# Patient Record
Sex: Male | Born: 1985 | Race: White | Hispanic: No | Marital: Single | Smoking: Current some day smoker
Health system: Southern US, Community
[De-identification: ages and names within clinical notes are randomized; demographics above are authoritative.]

## PROBLEM LIST (undated history)

## (undated) DIAGNOSIS — J45909 Unspecified asthma, uncomplicated: Secondary | ICD-10-CM

## (undated) DIAGNOSIS — E119 Type 2 diabetes mellitus without complications: Secondary | ICD-10-CM

## (undated) DIAGNOSIS — L409 Psoriasis, unspecified: Secondary | ICD-10-CM

## (undated) HISTORY — DX: Unspecified asthma, uncomplicated: J45.909

## (undated) HISTORY — PX: NO PAST SURGERIES: SHX2092

---
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
## 2012-12-06 ENCOUNTER — Emergency Department (HOSPITAL_COMMUNITY): Payer: BC Managed Care – PPO

## 2012-12-06 ENCOUNTER — Encounter (HOSPITAL_COMMUNITY): Payer: Self-pay

## 2012-12-06 ENCOUNTER — Emergency Department (HOSPITAL_COMMUNITY)
Admission: EM | Admit: 2012-12-06 | Discharge: 2012-12-06 | Disposition: A | Discharge status: Home / Self Care | Payer: BC Managed Care – PPO | Attending: Emergency Medicine | Admitting: Emergency Medicine

## 2012-12-06 DIAGNOSIS — M25569 Pain in unspecified knee: Secondary | ICD-10-CM | POA: Insufficient documentation

## 2012-12-06 DIAGNOSIS — M25562 Pain in left knee: Secondary | ICD-10-CM

## 2012-12-06 DIAGNOSIS — Z872 Personal history of diseases of the skin and subcutaneous tissue: Secondary | ICD-10-CM | POA: Insufficient documentation

## 2012-12-06 DIAGNOSIS — G8918 Other acute postprocedural pain: Secondary | ICD-10-CM | POA: Insufficient documentation

## 2012-12-06 HISTORY — DX: Psoriasis, unspecified: L40.9

## 2012-12-06 MED ORDER — OXYCODONE-ACETAMINOPHEN 5-325 MG PO TABS: 2.0000 | ORAL_TABLET | ORAL | Status: DC | PRN

## 2012-12-06 MED ORDER — OXYCODONE-ACETAMINOPHEN 5-325 MG PO TABS
2.0000 | ORAL_TABLET | Freq: Once | ORAL | Status: AC
  Administered 2012-12-06: 2 via ORAL
  Filled 2012-12-06: qty 2

## 2012-12-06 NOTE — ED Provider Notes (Signed)
27 year old male who awoke with left knee pain this morning. He denies any specific injury though he does note lifting heavy objects at work. On exam the patient has a normal-appearing joint, supple patella with movement, tenderness with flexion of the knee, he is able to straight leg raise without any difficulty, able to move his hip and ankle without any difficulty and has no pain with palpation over the tibial plateau lines or distal femur. He does have pain with anterior cruciate ligament stress, no joint laxity on stress.  I personally interpreted the complete left knee x-rays and find her to be no signs of fractures or dislocations, no signs of osteoarthritis. The patient will be treated with NSAIDs, immobilization and crutches and followup with orthopedics.  Medical screening examination/treatment/procedure(s) were conducted as a shared visit with non-physician practitioner(s) and myself.  I personally evaluated the patient during the encounter    Vida Roller, MD 12/06/12 [phone]

## 2012-12-06 NOTE — ED Notes (Signed)
Fall risk band applied.

## 2012-12-06 NOTE — Progress Notes (Signed)
Orthopedic Tech Progress Note Patient Details:  Chase Gray 05/08/1986 [id number] Knee immobilizer applied to Left LE with instructions. Crutches given and patient demonstrated proper crutch use Ortho Devices Type of Ortho Device: Crutches;Knee Immobilizer Ortho Device/Splint Interventions: Application   Asia R Thompson 12/06/2012, 8:23 AM

## 2012-12-06 NOTE — ED Provider Notes (Signed)
History     CSN: [id number]  Arrival date & time 12/06/12  0621   None     Chief Complaint  Patient presents with  . Knee Pain    Left    (Consider location/radiation/quality/duration/timing/severity/associated sxs/prior treatment) HPI Comments: Always a little sore after work. Last night it was normal. This morning notices left knee is hurting. Not swollen. Denies any trauma, twisting, hearing a popping sound. Pt did not take any interventions, did not ice.  Patient is a 27 y.o. male presenting with left knee pain  Severity: severe Onset quality: acute Duration: 2 hours  Timing: Constant  Progression: worse  Relieved by: rest Worsened by: movement, wt bearing Ineffective treatments: None tried     Patient is a 27 y.o. male presenting with knee pain.  Knee Pain Associated symptoms: no back pain, no fever and no neck pain     Past Medical History  Diagnosis Date  . Psoriasis     No past surgical history on file.  No family history on file.  History  Substance Use Topics  . Smoking status: Never Smoker   . Smokeless tobacco: Never Used  . Alcohol Use: 0.5 oz/week    1 drink(s) per week      Review of Systems  Constitutional: Negative for fever and diaphoresis.  HENT: Negative for neck pain and neck stiffness.   Eyes: Negative for visual disturbance.  Respiratory: Negative for apnea, chest tightness and shortness of breath.   Cardiovascular: Negative for chest pain and palpitations.  Gastrointestinal: Negative for nausea, vomiting, diarrhea and constipation.  Genitourinary: Negative for dysuria.  Musculoskeletal: Positive for arthralgias and gait problem. Negative for back pain.       Left knee pain  Skin: Negative for rash.  Neurological: Negative for dizziness, weakness, light-headedness, numbness and headaches.    Allergies  Review of patient's allergies indicates no known allergies.  Home Medications  No current outpatient prescriptions on  file.  BP 145/88  Pulse 115  Temp(Src) 98.7 F (37.1 C) (Oral)  Resp 18  Ht 5\' 9"  (1.753 m)  Wt 328 lb (148.78 kg)  BMI 48.42 kg/m2  Physical Exam  Nursing note and vitals reviewed. Constitutional: He is oriented to person, place, and time. He appears well-developed and well-nourished. No distress.  HENT:  Head: Normocephalic and atraumatic.  Eyes: EOM are normal. Pupils are equal, round, and reactive to light.  Neck: Normal range of motion. Neck supple.  No meningeal signs  Cardiovascular: Normal rate, regular rhythm, normal heart sounds and intact distal pulses.  Exam reveals no gallop and no friction rub.   No murmur heard. Pulmonary/Chest: Effort normal and breath sounds normal. No respiratory distress. He has no wheezes. He has no rales. He exhibits no tenderness.  Abdominal: Soft. Bowel sounds are normal. He exhibits no distension. There is no tenderness. There is no rebound and no guarding.  Musculoskeletal: Normal range of motion. He exhibits no edema and no tenderness.       Legs: 5/5 muscle strength throughout, painful weight bearing, able to straight leg raise injured leg. No tenderness to palpation. Tender with movement, inferior patella. No joint laxity noted. FROM at hip and ankle. No effusion appreciated on exam.  Neurological: He is alert and oriented to person, place, and time. No cranial nerve deficit.  Sensitivity to light touch in tact. No focal deficits.  Skin: Skin is warm and dry. He is not diaphoretic. No erythema.    ED Course  Procedures (including  critical care time)  Labs Reviewed - No data to display No results found. Dg Knee 2 Views Left  12/06/2012  *RADIOLOGY REPORT*  Clinical Data: Knee pain  LEFT KNEE - 1-2 VIEW  Comparison: None.  Findings: No acute fracture.  No dislocation.  Minimal osteophyte formation at the superior and inferior patella.  Unremarkable soft tissues.  IMPRESSION: No acute bony pathology.   Original Report Authenticated By:  Jolaine Click, M.D.     Diagnosis: left knee pain.    MDM  Possible meniscus tear, consider tibial plateau injury in larger pt, poss effusion. Imaging negative for bony pathology. Directed pt to ice injury, take acetaminophen or ibuprofen for pain, and to elevate and rest the injury when possible. Provided knee immobilizer and crutches for support and comfort. Provided back to work note.  At this time there does not appear to be any evidence of an acute emergency medical condition and the patient appears stable for discharge with appropriate outpatient follow up. Diagnosis was discussed with patient who verbalizes understanding and is agreeable to discharge. Pt case discussed with and seen by Dr. Hyacinth Meeker who agrees with my plan.     Glade Nurse, PA-C 12/06/12 1126

## 2012-12-06 NOTE — ED Notes (Signed)
Patient returned from radiology

## 2012-12-06 NOTE — ED Notes (Signed)
The patient states that he has two jobs which require a significant physical exertion.  Today, before he went to sleep, he said he had a normal amount of soreness.  When he woke up, he stated that he had trouble bending his knee.  He denies any known trauma.

## 2012-12-06 NOTE — ED Notes (Signed)
Called ortho tech 

## 2012-12-09 NOTE — ED Provider Notes (Signed)
Medical screening examination/treatment/procedure(s) were conducted as a shared visit with non-physician practitioner(s) and myself.  I personally evaluated the patient during the encounter  Please see my separate respective documentation pertaining to this patient encounter   Vida Roller, MD 12/09/12 [phone]

## 2014-07-13 ENCOUNTER — Inpatient Hospital Stay: Payer: Self-pay | Admitting: Internal Medicine

## 2014-07-13 LAB — BETA-HYDROXYBUTYRIC ACID: Beta-Hydroxybutyrate: >46 mg/dL (ref 0.2–2.8)

## 2014-07-13 LAB — CBC WITH DIFFERENTIAL/PLATELET
COMMENT - H1-COM1: NORMAL
COMMENT - H1-COM2: NORMAL
HCT: 49 % (ref 40.0–52.0)
HGB: 16.4 g/dL (ref 13.0–18.0)
LYMPHS PCT: 9 %
MCH: 31.1 pg (ref 26.0–34.0)
MCHC: 33.4 g/dL (ref 32.0–36.0)
MCV: 93 fL (ref 80–100)
Metamyelocyte: 1 %
Monocytes: 5 %
Platelet: 280 10*3/uL (ref 150–440)
RBC: 5.26 10*6/uL (ref 4.40–5.90)
RDW: 14.7 % — ABNORMAL HIGH (ref 11.5–14.5)
Segmented Neutrophils: 85 %
WBC: 11.2 10*3/uL — ABNORMAL HIGH (ref 3.8–10.6)

## 2014-07-13 LAB — URINALYSIS, COMPLETE
BACTERIA: NONE SEEN
BILIRUBIN, UR: NEGATIVE
Glucose,UR: >=500 mg/dL (ref 0–75)
Leukocyte Esterase: NEGATIVE
NITRITE: NEGATIVE
PH: 6 (ref 4.5–8.0)
Protein: 30
Specific Gravity: 1.029 (ref 1.003–1.030)
WBC UR: <1 /HPF (ref 0–5)

## 2014-07-13 LAB — BASIC METABOLIC PANEL
ANION GAP: 16 (ref 7–16)
Anion Gap: 27 — ABNORMAL HIGH (ref 7–16)
BUN: 11 mg/dL (ref 7–18)
BUN: 8 mg/dL (ref 7–18)
CALCIUM: 9.6 mg/dL (ref 8.5–10.1)
CHLORIDE: 101 mmol/L (ref 98–107)
CHLORIDE: 111 mmol/L — AB (ref 98–107)
CO2: 7 mmol/L — AB (ref 21–32)
CREATININE: 0.91 mg/dL (ref 0.60–1.30)
CREATININE: 0.82 mg/dL (ref 0.60–1.30)
Calcium, Total: 8.8 mg/dL (ref 8.5–10.1)
Co2: 14 mmol/L — ABNORMAL LOW (ref 21–32)
EGFR (African American): >60
EGFR (African American): >60
EGFR (Non-African Amer.): >60
GLUCOSE: 511 mg/dL — AB (ref 65–99)
Glucose: 247 mg/dL — ABNORMAL HIGH (ref 65–99)
OSMOLALITY: 288 (ref 275–301)
Osmolality: 292 (ref 275–301)
Potassium: 3.9 mmol/L (ref 3.5–5.1)
Potassium: 3.6 mmol/L (ref 3.5–5.1)
SODIUM: 135 mmol/L — AB (ref 136–145)
Sodium: 141 mmol/L (ref 136–145)

## 2014-07-13 LAB — HEMOGLOBIN A1C: HEMOGLOBIN A1C: 13.1 % — AB (ref 4.2–6.3)

## 2014-07-14 LAB — BASIC METABOLIC PANEL
ANION GAP: 9 (ref 7–16)
Anion Gap: 12 (ref 7–16)
BUN: 8 mg/dL (ref 7–18)
BUN: 9 mg/dL (ref 7–18)
CHLORIDE: 113 mmol/L — AB (ref 98–107)
CO2: 20 mmol/L — AB (ref 21–32)
CREATININE: 0.81 mg/dL (ref 0.60–1.30)
CREATININE: 0.83 mg/dL (ref 0.60–1.30)
Calcium, Total: 8.6 mg/dL (ref 8.5–10.1)
Calcium, Total: 8.7 mg/dL (ref 8.5–10.1)
Chloride: 109 mmol/L — ABNORMAL HIGH (ref 98–107)
Co2: 17 mmol/L — ABNORMAL LOW (ref 21–32)
EGFR (Non-African Amer.): >60
GLUCOSE: 283 mg/dL — AB (ref 65–99)
Glucose: 93 mg/dL (ref 65–99)
OSMOLALITY: 285 (ref 275–301)
Osmolality: 281 (ref 275–301)
Potassium: 3 mmol/L — ABNORMAL LOW (ref 3.5–5.1)
Potassium: 3.9 mmol/L (ref 3.5–5.1)
SODIUM: 142 mmol/L (ref 136–145)
Sodium: 138 mmol/L (ref 136–145)

## 2014-07-15 LAB — CBC WITH DIFFERENTIAL/PLATELET
BASOS ABS: 0 10*3/uL (ref 0.0–0.1)
Basophil %: 0.3 %
EOS ABS: 0.1 10*3/uL (ref 0.0–0.7)
EOS PCT: 1.3 %
HCT: 40.4 % (ref 40.0–52.0)
HGB: 13.9 g/dL (ref 13.0–18.0)
Lymphocyte #: 1.9 10*3/uL (ref 1.0–3.6)
Lymphocyte %: 30.4 %
MCH: 31.5 pg (ref 26.0–34.0)
MCHC: 34.5 g/dL (ref 32.0–36.0)
MCV: 91 fL (ref 80–100)
MONO ABS: 0.7 x10 3/mm (ref 0.2–1.0)
Monocyte %: 11.4 %
NEUTROS PCT: 56.6 %
Neutrophil #: 3.5 10*3/uL (ref 1.4–6.5)
Platelet: 179 10*3/uL (ref 150–440)
RBC: 4.42 10*6/uL (ref 4.40–5.90)
RDW: 14.3 % (ref 11.5–14.5)
WBC: 6.2 10*3/uL (ref 3.8–10.6)

## 2014-07-15 LAB — BASIC METABOLIC PANEL
ANION GAP: 12 (ref 7–16)
ANION GAP: 12 (ref 7–16)
BUN: 7 mg/dL (ref 7–18)
BUN: 9 mg/dL (ref 7–18)
CALCIUM: 8.9 mg/dL (ref 8.5–10.1)
CALCIUM: 8.9 mg/dL (ref 8.5–10.1)
CO2: 24 mmol/L (ref 21–32)
CREATININE: 0.66 mg/dL (ref 0.60–1.30)
Chloride: 103 mmol/L (ref 98–107)
Chloride: 100 mmol/L (ref 98–107)
Co2: 23 mmol/L (ref 21–32)
Creatinine: 0.62 mg/dL (ref 0.60–1.30)
EGFR (African American): >60
EGFR (African American): >60
EGFR (Non-African Amer.): >60
GLUCOSE: 246 mg/dL — AB (ref 65–99)
Glucose: 210 mg/dL — ABNORMAL HIGH (ref 65–99)
Osmolality: 282 (ref 275–301)
Potassium: 2.8 mmol/L — ABNORMAL LOW (ref 3.5–5.1)
Potassium: 3.3 mmol/L — ABNORMAL LOW (ref 3.5–5.1)
SODIUM: 135 mmol/L — AB (ref 136–145)
Sodium: 139 mmol/L (ref 136–145)

## 2015-02-06 NOTE — Discharge Summary (Signed)
PATIENT NAME:  Chase Gray, [name] MR#:  [id number] DATE OF BIRTH:  05-06-1986  DATE OF ADMISSION:  07/13/2014 DATE OF DISCHARGE:  07/15/2014  DISCHARGE DIAGNOSES: 1. Diabetic ketoacidosis.  2. 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  3. Hypokalemia.  4. Obesity.  5. Psoriasis.   CONSULTATIONS: Dr. Tedd SiasSolum, Endocrinology.   PROCEDURES: None.  BRIEF HISTORY OF PRESENT ILLNESS: This very pleasant, 29 year old male [facility] complaining of dizziness. At that time it was determined that his blood sugar was greater than 700 and he was brought to the Emergency Room for further evaluation. He was noted to be in diabetic ketoacidosis with a bicarbonate of 7. He was admitted for management of diabetic ketoacidosis.   HOSPITAL COURSE AND TREATMENT: 1. Diabetic ketoacidosis: Initial labs showed significant acidosis with a bicarbonate of 7 and venous pH of 7.2. Presenting blood sugar was 511. He was placed on an insulin drip, high rate IV fluids and admitted to the step-down unit for close monitoring. He was transitioned to subcutaneous insulin once his anion gap had closed per the DKA protocol. At the time of discharge, he is doing well with basal, Levemir and Novolin with meals. He has received diabetes education and insulin teaching. He has a glucometer and test strips to use at home. He has prescriptions for Levemir and NovoLog. He will follow up with endocrinology within 2 weeks of discharge.  2. Hypokalemia: On the morning of discharge his initial potassium was 2.9. He was repleted with oral potassium. At the time of discharge, his potassium is 3.4 and he has received 40 mEq additional p.o. potassium. He will need to have his potassium checked at his outpatient appointment.  3. Obesity: This is likely contributing to insulin resistance. He is encouraged to lose weight via exercise and improved diet.  4. Psoriasis: Currently controlled without any  additional medications.   PHYSICAL EXAMINATION: VITAL SIGNS: Temperature 98.4, heart rate 105, respirations 18, blood pressure 125/80, oxygen saturation 96% on room air.  GENERAL: No acute distress.  CARDIOVASCULAR: Tachycardic, regular. No murmurs, rubs, or gallops, no peripheral edema. Peripheral pulses are 2+.  RESPIRATORY: Lungs are clear to auscultation bilaterally with good air movement.  ABDOMEN: Soft, nontender, bowel sounds are normal.  NEUROLOGIC: Cranial nerves II through XII are grossly intact. Strength and sensation are intact, nonfocal neurologic examination.  PSYCHIATRIC: The patient is calm, alert and oriented x 4, good insight.   LABORATORY:  Sodium 135, potassium 3.4, chloride 100, bicarbonate 23, BUN 9, creatinine 0.62, glucose 240, white blood cells 6.2, hemoglobin 13.9, platelets 179,000, MCV 91.   DISCHARGE MEDICATIONS: 1. Insulin detemir 100 units/ML subcutaneous solution 30 units once a day at bedtime.  2. Insulin aspart 100 units/ML subcutaneous solution 15 units subcutaneously 3 times a day with meals.   DISPOSITION: The patient is discharged to home.   DISCHARGE INSTRUCTIONS: The patient will return to work on Monday, October 5. He is to follow up with endocrinology within 2 weeks after discharge. He is to call his endocrinology office if blood sugars are uncontrolled.   DIET: Carbohydrate  modified ADA diet.   ACTIVITY: Activity is encouraged, no restrictions.   TIME SPENT ON DISCHARGE: 40 minutes.   ____________________________ Ena Dawleyatherine P. Clent RidgesWalsh, MD cpw:hh D: 07/15/2014 15:49:12 ET T: 07/16/2014 01:07:27 ET JOB#: [id number]  cc: Ena Dawleyatherine P. Clent RidgesWalsh, MD, <Dictator> Gale JourneyATHERINE P WALSH MD ELECTRONICALLY SIGNED 07/22/2014 13:31

## 2015-02-06 NOTE — Consult Note (Signed)
PATIENT NAME:  Chase Gray, Chase Gray MR#:  [id number] DATE OF BIRTH:  1986-05-12  DATE OF CONSULTATION:  07/13/2014  CONSULTING PHYSICIAN:  A. Wendall MolaMelissa Solum, MD REQUESTING PROVIDER: Adrian SaranSital Mody, MD  CHIEF COMPLAINT: New onset diabetes/DKA.   HISTORY OF PRESENT ILLNESS: This is a 29 year old male with no significant past medical history who was admitted today from the Emergency Room with diabetic ketoacidosis. The patient reports several weeks of dizziness, polyuria, and polydipsia. He also has had a 77 pound weight loss over the last 3 months which he attributes to a change in diet and regular exercise. [place] [facility] [place] [facility] [place] was found to have elevated blood sugars and sent to the ER. In the [facility] ER, venous blood sugar was initially 511, bicarbonate was  reduced to 7, anion gap elevated at 27, beta hydroxybutyrate elevated at 46, and hemoglobin A1c was 13.1%, urine ketones were 2 +, consistent with diabetic ketoacidosis in setting of uncontrolled diabetes. The patient was initiated on IV fluids and IV insulin. More recent blood sugars have improved and last chemistry panel showed his anion gap was down to 16 and bicarbonate improved to 14. He denies nausea, vomiting. Appetite is reduced. Denies recent exposure to glucocorticoids. No recent illness.   PAST MEDICAL HISTORY:  None.    PAST SURGICAL HISTORY: None.   SOCIAL HISTORY: No tobacco or alcohol use.   FAMILY HISTORY: Paternal grandfather had diabetes. No other known family members with diabetes. No known cardiovascular disease. No known autoimmune disorders.   OUTPATIENT MEDICATIONS: None.   ALLERGIES: No known drug allergies.   REVIEW OF SYSTEMS.  GENERAL: Weight loss as per HPI. No fever.  HEENT: No blurred vision. No sore throat.  NECK: No neck pain. No dysphagia.  CARDIAC: No chest pain. No palpitations.  PULMONARY: No cough. No shortness of breath.  ABDOMEN: Appetite is  reduced. No nausea. No vomiting.  EXTREMITIES: Denies lower extremity swelling.  SKIN: Denies rash or recent skin changes. Denies pruritus.  ENDOCRINE: Denies heat or cold intolerance.  GENITOURINARY: Admit to polyuria. No dysuria.  NEUROLOGIC: No tremor. No recent falls.   PHYSICAL EXAMINATION:  VITAL SIGNS: Height 70 inches, weight 273 pounds, BMI 39. Temperature 98.4, pulse 109, respirations 16, blood pressure 119/63, pulse oximetry 99% on room air.  GENERAL: Obese young white male in no acute distress.  HEENT: EOMI. Oropharynx is clear.  NECK: Supple. No thyromegaly.  CARDIAC: Regular rate and rhythm. No murmur. No carotid bruit.  ABDOMEN: Diffusely soft, nontender, nondistended.  PULMONARY: Clear to auscultation bilaterally. No wheeze.  EXTREMITIES: No peripheral edema is present.  SKIN: No rash or dermatopathy is present.  NEUROLOGIC: Unremarkable, no focal deficits, cranial nerves intact II-XII.  PSYCHIATRIC: Calm and cooperative.   LABORATORY DATA: Initial labs revealed glucose 511, BUN 11, creatinine 0.91, sodium 135, potassium 3.9, bicarbonate 7. Hemoglobin A1c 13.1%. Hemoglobin 16.4, hematocrit 49%, WBC 11.2, platelets 280,000. Urinalysis with greater than 500 glucose, 2 + urinary ketones and 30 mg/dl protein.   ASSESSMENT: A 29 year old male admitted with diabetic ketoacidosis in setting of new diagnosis of uncontrolled diabetes. It is not clear if this is type 1 versus type 2 diabetes. Young age in the setting of DKA would suggest type 1 diabetes, however morbid obesity in this young male could support type 2 diabetes.   RECOMMENDATIONS:  1.  Agree with use of IV insulin DKA protocol. Plan to transition patient per protocol to subcutaneous insulin once anion  gap has closed and blood sugars have normalized.  2.  Continue high rate IV fluids.  3.  Will request inpatient diabetes educator consult to assist with instruction on disease, medications, and self-monitoring of blood  glucose levels.  4.  Anticipate once ready for discharge, he will go home on insulin basal-bolus therapy. Insulin pens would make for ease of use. He will need in addition to pens and pen needles, blood glucose test strips and a glucometer prescribed.  5.  Will arrange for outpatient followup, once ready for discharge.   Thank you for the kind request for consultation. I will follow along with you.    ____________________________ A. Wendall Mola, MD ams:bu D: 07/13/2014 20:59:00 ET T: 07/13/2014 21:13:04 ET JOB#: [id number]  cc: A. Wendall Mola, MD, <Dictator> Macy Mis MD ELECTRONICALLY SIGNED 07/14/2014 8:39

## 2015-02-06 NOTE — H&P (Signed)
PATIENT NAME:  Chase Gray, Chase Gray MR#:  [id number] DATE OF BIRTH:  1986/04/08  DATE OF ADMISSION:  07/13/2014  PRIMARY CARE PHYSICIAN: None.   CHIEF COMPLAINT: Dizziness.   HISTORY OF PRESENT ILLNESS: A 29 year old male who [facility] complaining of dizziness. His blood sugars were over 700. He was brought here for further evaluation. In the ER, he is noted to be in DKA with a bicarbonate of 7. He has only received 1 liter of IV fluid.   REVIEW OF SYSTEMS: CONSTITUTIONAL: No fever. Positive fatigue and weakness. EYES: No blurred or double vision, glaucoma, EARS, NOSE AND THROAT: No ear pain, hearing loss, seasonal allergies, postnasal drip. RESPIRATORY: No cough, wheezing, hemoptysis, COPD. CARDIOVASCULAR: No chest pain, orthopnea, edema, arrhythmia, dyspnea on exertion, palpitations, syncope. GASTROINTESTINAL: No nausea, vomiting, diarrhea, abdominal pain, melena, or ulcers. GENITOURINARY: No dysuria or hematuria.  ENDOCRINE: Positive polyuria, positive polydipsia.  HEMATOLOGIC/LYMPHATICS: No anemia, easy bruising.  SKIN: No rash or lesions. MUSCULOSKELETAL: Limited activity due to tendinitis.  NEUROLOGICAL: No history of CVA, TIA or seizures. PSYCHIATRIC: No anxiety or depression.   PAST MEDICAL HISTORY:  1.  Psoriasis.  2.  Tendinitis.   ALLERGIES: No known allergies.   MEDICATIONS: None.   SOCIAL HISTORY: No tobacco, alcohol or IV drug use.   FAMILY HISTORY: No hypertension or diabetes in the family.   PHYSICAL EXAMINATION:  VITAL SIGNS: Temperature 98.1, pulse 120, respirations 18, blood pressure 135/78 and 100% on room air.  GENERAL: The patient is alert, oriented, not in acute distress.  HEENT: Head is atraumatic. Pupils are round. Sclerae are anicteric. Mucous membranes  are very dry. Oropharynx is clear. NECK: Supple. No JVD, carotid bruit or enlarged thyroid.  CARDIOVASCULAR: Regular rate and rhythm. No murmurs, gallops or rubs. PMI is  not displaced. LUNGS: Clear to auscultation without crackles, rhonchi or wheezing. Normal chest expansion.  ABDOMEN: Obese. Bowel sounds are positive. Nontender. Hard to appreciate any organomegaly due to body habitus.  EXTREMITIES: No clubbing, cyanosis or edema.  NEUROLOGICAL: Cranial nerves II-XII intact. No focal deficits.  SKIN: The patient has psoriasis on his arms.   LABORATORIES: PH venous sample is 7.22, pCO2 of 24. Blood sugar currently is 481. Beta-hydroxybutyrate is greater than 46. White blood cells 11.2, hemoglobin 16.4, hematocrit 49, platelets 280,000. Sodium 135, potassium 3.9, chloride 101, bicarbonate 27, BUN 11, creatinine 0.91. Glucose on that BMP was 511.   Urinalysis is negative for LCE and nitrites.   ASSESSMENT AND PLAN: This is a 29 year old male who presents with dizziness, polyuria, polydipsia, has new onset diabetic ketoacidosis.  1.  Diabetic ketoacidosis. The patient likely has type 2 diabetes insulin-dependent with acidosis. His bicarbonate is 7, beta-hydroxyurea is elevated at 46. The patient will need aggressive hydration. I have asked the nurse to provide at least 2 more liters of fluids. He is on diabetic ketoacidosis protocol. We will repeat the BMP. I will also consult inpatient diabetes as well as Dr. Tedd SiasSolum for new-onset diabetes. Likely, the patient will need to be discharged with insulin.  2.  Mild leukocytosis secondary to diabetic ketoacidosis and dehydration. We will monitor urinalysis, does not have any source of infection. and lungs on examination are clear. The patient has no signs or symptoms of pulmonary infection. 3.  Obesity. Encouraged weight loss as tolerated.  4.  Psoriasis. The patient does not take any medications.  CODE STATUS: The patient is a full code status.   The patient will need a PCP prior to  discharge.   TIME SPENT: Approximately 45 minutes.    ____________________________ Janyth Contes. Juliene Pina, MD spm:TT D: 07/13/2014 14:15:21  ET T: 07/13/2014 14:58:28 ET JOB#: [id number]  cc: Aliyana Dlugosz P. Juliene Pina, MD, <Dictator> Janyth Contes Cotey Rakes MD ELECTRONICALLY SIGNED 07/13/2014 16:12

## 2015-05-02 ENCOUNTER — Emergency Department (HOSPITAL_COMMUNITY)
Admission: EM | Admit: 2015-05-02 | Discharge: 2015-05-02 | Disposition: A | Discharge status: Home / Self Care | Payer: BLUE CROSS/BLUE SHIELD | Attending: Emergency Medicine | Admitting: Emergency Medicine

## 2015-05-02 ENCOUNTER — Emergency Department (HOSPITAL_COMMUNITY): Payer: BLUE CROSS/BLUE SHIELD

## 2015-05-02 ENCOUNTER — Encounter (HOSPITAL_COMMUNITY): Payer: Self-pay | Admitting: Emergency Medicine

## 2015-05-02 DIAGNOSIS — F419 Anxiety disorder, unspecified: Secondary | ICD-10-CM | POA: Diagnosis not present

## 2015-05-02 DIAGNOSIS — E119 Type 2 diabetes mellitus without complications: Secondary | ICD-10-CM | POA: Insufficient documentation

## 2015-05-02 DIAGNOSIS — Z872 Personal history of diseases of the skin and subcutaneous tissue: Secondary | ICD-10-CM | POA: Insufficient documentation

## 2015-05-02 DIAGNOSIS — Y9389 Activity, other specified: Secondary | ICD-10-CM | POA: Diagnosis not present

## 2015-05-02 DIAGNOSIS — Y999 Unspecified external cause status: Secondary | ICD-10-CM | POA: Diagnosis not present

## 2015-05-02 DIAGNOSIS — Y92410 Unspecified street and highway as the place of occurrence of the external cause: Secondary | ICD-10-CM | POA: Insufficient documentation

## 2015-05-02 DIAGNOSIS — S6992XA Unspecified injury of left wrist, hand and finger(s), initial encounter: Secondary | ICD-10-CM | POA: Diagnosis not present

## 2015-05-02 HISTORY — DX: Type 2 diabetes mellitus without complications: E11.9

## 2015-05-02 MED ORDER — OXYCODONE-ACETAMINOPHEN 5-325 MG PO TABS: 2.0000 | ORAL_TABLET | ORAL | Status: DC | PRN

## 2015-05-02 MED ORDER — OXYCODONE-ACETAMINOPHEN 5-325 MG PO TABS
2.0000 | ORAL_TABLET | Freq: Once | ORAL | Status: AC
  Administered 2015-05-02: 2 via ORAL
  Filled 2015-05-02: qty 2
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
## 2015-09-02 ENCOUNTER — Emergency Department (HOSPITAL_COMMUNITY)
Admission: EM | Admit: 2015-09-02 | Discharge: 2015-09-03 | Disposition: A | Discharge status: Home / Self Care | Payer: BLUE CROSS/BLUE SHIELD | Attending: Emergency Medicine | Admitting: Emergency Medicine

## 2015-09-02 ENCOUNTER — Encounter (HOSPITAL_COMMUNITY): Payer: Self-pay | Admitting: Emergency Medicine

## 2015-09-02 DIAGNOSIS — S99911A Unspecified injury of right ankle, initial encounter: Secondary | ICD-10-CM | POA: Insufficient documentation

## 2015-09-02 DIAGNOSIS — Y92481 Parking lot as the place of occurrence of the external cause: Secondary | ICD-10-CM | POA: Insufficient documentation

## 2015-09-02 DIAGNOSIS — E119 Type 2 diabetes mellitus without complications: Secondary | ICD-10-CM | POA: Diagnosis not present

## 2015-09-02 DIAGNOSIS — S2231XA Fracture of one rib, right side, initial encounter for closed fracture: Secondary | ICD-10-CM | POA: Diagnosis not present

## 2015-09-02 DIAGNOSIS — S299XXA Unspecified injury of thorax, initial encounter: Secondary | ICD-10-CM | POA: Diagnosis present

## 2015-09-02 DIAGNOSIS — Y998 Other external cause status: Secondary | ICD-10-CM | POA: Insufficient documentation

## 2015-09-02 DIAGNOSIS — Z872 Personal history of diseases of the skin and subcutaneous tissue: Secondary | ICD-10-CM | POA: Diagnosis not present

## 2015-09-02 DIAGNOSIS — Y9355 Activity, bike riding: Secondary | ICD-10-CM | POA: Insufficient documentation
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
## 2015-09-02 NOTE — ED Notes (Signed)
Bed: WA07 Expected date:  Expected time:  Means of arrival:  Comments: 29 yo M  Fall, right shoulder and ankle pain

## 2015-09-03 ENCOUNTER — Emergency Department (HOSPITAL_COMMUNITY): Payer: BLUE CROSS/BLUE SHIELD

## 2015-09-03 MED ORDER — HYDROCODONE-ACETAMINOPHEN 5-325 MG PO TABS: 1.0000 | ORAL_TABLET | Freq: Four times a day (QID) | ORAL | Status: DC | PRN

## 2015-09-03 MED ORDER — HYDROCODONE-ACETAMINOPHEN 5-325 MG PO TABS
1.0000 | ORAL_TABLET | Freq: Once | ORAL | Status: AC
  Administered 2015-09-03: 1 via ORAL
  Filled 2015-09-03: qty 1

## 2015-09-03 MED ORDER — IBUPROFEN 800 MG PO TABS: 800.0000 mg | ORAL_TABLET | Freq: Three times a day (TID) | ORAL | Status: DC | PRN

## 2015-09-03 NOTE — Discharge Instructions (Signed)
Return here as needed.  Follow-up with your orthopedic doctor.  Ice and heat on the areas that are sore
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
## 2016-01-31 ENCOUNTER — Ambulatory Visit (INDEPENDENT_AMBULATORY_CARE_PROVIDER_SITE_OTHER): Payer: BLUE CROSS/BLUE SHIELD

## 2016-01-31 ENCOUNTER — Ambulatory Visit (INDEPENDENT_AMBULATORY_CARE_PROVIDER_SITE_OTHER): Payer: BLUE CROSS/BLUE SHIELD | Admitting: Physician Assistant

## 2016-01-31 VITALS — BP 124/76 | HR 102 | Temp 98.4°F | Resp 16 | Ht 70.0 in | Wt 313.0 lb

## 2016-01-31 DIAGNOSIS — J45909 Unspecified asthma, uncomplicated: Secondary | ICD-10-CM | POA: Insufficient documentation

## 2016-01-31 DIAGNOSIS — R0781 Pleurodynia: Secondary | ICD-10-CM

## 2016-01-31 DIAGNOSIS — R059 Cough, unspecified: Secondary | ICD-10-CM

## 2016-01-31 DIAGNOSIS — R05 Cough: Secondary | ICD-10-CM

## 2016-01-31 DIAGNOSIS — E1165 Type 2 diabetes mellitus with hyperglycemia: Secondary | ICD-10-CM | POA: Insufficient documentation

## 2016-01-31 DIAGNOSIS — L409 Psoriasis, unspecified: Secondary | ICD-10-CM | POA: Insufficient documentation

## 2016-01-31 DIAGNOSIS — E119 Type 2 diabetes mellitus without complications: Secondary | ICD-10-CM | POA: Insufficient documentation

## 2016-01-31 MED ORDER — ALBUTEROL SULFATE (2.5 MG/3ML) 0.083% IN NEBU
2.5000 mg | INHALATION_SOLUTION | Freq: Once | RESPIRATORY_TRACT | Status: AC
  Administered 2016-01-31: 2.5 mg via RESPIRATORY_TRACT

## 2016-01-31 MED ORDER — HYDROCOD POLST-CPM POLST ER 10-8 MG/5ML PO SUER: 5.0000 mL | Freq: Two times a day (BID) | ORAL | Status: DC | PRN

## 2016-01-31 MED ORDER — BECLOMETHASONE DIPROPIONATE 80 MCG/ACT IN AERS: 2.0000 | INHALATION_SPRAY | Freq: Two times a day (BID) | RESPIRATORY_TRACT | Status: DC

## 2016-01-31 MED ORDER — AEROCHAMBER PLUS MISC: Status: DC

## 2016-01-31 MED ORDER — IPRATROPIUM BROMIDE 0.02 % IN SOLN
0.5000 mg | Freq: Once | RESPIRATORY_TRACT | Status: AC
  Administered 2016-01-31: 0.5 mg via RESPIRATORY_TRACT

## 2016-01-31 NOTE — Patient Instructions (Signed)
     IF you received an x-ray today, you will receive an invoice from [facility]. Please contact [facility] at [phone] with questions or concerns regarding your invoice.   IF you received labwork today, you will receive an invoice from [facility]. Please contact [facility] at [phone] with questions or concerns regarding your invoice.   Our billing staff will not be able to assist you with questions regarding bills from these companies.  You will be contacted with the lab results as soon as they are available. The fastest way to get your results is to activate your My Chart account. Instructions are located on the last page of this paperwork. If you have not heard from us regarding the results in 2 weeks, please contact this office.      
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
## 2016-02-10 ENCOUNTER — Emergency Department (HOSPITAL_COMMUNITY)
Admission: EM | Admit: 2016-02-10 | Discharge: 2016-02-11 | Disposition: A | Discharge status: Home / Self Care | Payer: BLUE CROSS/BLUE SHIELD | Attending: Emergency Medicine | Admitting: Emergency Medicine

## 2016-02-10 ENCOUNTER — Encounter (HOSPITAL_COMMUNITY): Payer: Self-pay

## 2016-02-10 DIAGNOSIS — J45909 Unspecified asthma, uncomplicated: Secondary | ICD-10-CM | POA: Insufficient documentation

## 2016-02-10 DIAGNOSIS — Z79899 Other long term (current) drug therapy: Secondary | ICD-10-CM | POA: Diagnosis not present

## 2016-02-10 DIAGNOSIS — S29002A Unspecified injury of muscle and tendon of back wall of thorax, initial encounter: Secondary | ICD-10-CM | POA: Diagnosis present

## 2016-02-10 DIAGNOSIS — Y9389 Activity, other specified: Secondary | ICD-10-CM | POA: Insufficient documentation

## 2016-02-10 DIAGNOSIS — Z872 Personal history of diseases of the skin and subcutaneous tissue: Secondary | ICD-10-CM | POA: Diagnosis not present

## 2016-02-10 DIAGNOSIS — S29012A Strain of muscle and tendon of back wall of thorax, initial encounter: Secondary | ICD-10-CM | POA: Diagnosis not present

## 2016-02-10 DIAGNOSIS — Z7951 Long term (current) use of inhaled steroids: Secondary | ICD-10-CM | POA: Diagnosis not present

## 2016-02-10 DIAGNOSIS — E119 Type 2 diabetes mellitus without complications: Secondary | ICD-10-CM | POA: Diagnosis not present

## 2016-02-10 DIAGNOSIS — Y9289 Other specified places as the place of occurrence of the external cause: Secondary | ICD-10-CM | POA: Diagnosis not present

## 2016-02-10 DIAGNOSIS — Y998 Other external cause status: Secondary | ICD-10-CM | POA: Insufficient documentation

## 2016-02-10 DIAGNOSIS — X500XXA Overexertion from strenuous movement or load, initial encounter: Secondary | ICD-10-CM | POA: Insufficient documentation

## 2016-02-10 NOTE — ED Notes (Addendum)
Pt c/o mid back pain 9/10 during ambulation and 5/10 when sitting that started at 9pm. Pt denies recent back trauma/injury. Pt denies hx of kidney stones and no recent dysuria. Pt A+OX4, speaking in complete sentences in a seated position on the side of the bed.

## 2016-02-11 LAB — URINALYSIS, ROUTINE W REFLEX MICROSCOPIC
Glucose, UA: NEGATIVE mg/dL
Hgb urine dipstick: NEGATIVE
KETONES UR: NEGATIVE mg/dL
LEUKOCYTES UA: NEGATIVE
NITRITE: NEGATIVE
Protein, ur: NEGATIVE mg/dL
SPECIFIC GRAVITY, URINE: 1.036 — AB (ref 1.005–1.030)
pH: 6 (ref 5.0–8.0)

## 2016-02-11 MED ORDER — TRAMADOL HCL 50 MG PO TABS: 50.0000 mg | ORAL_TABLET | Freq: Four times a day (QID) | ORAL | Status: DC | PRN

## 2016-02-11 MED ORDER — IBUPROFEN 800 MG PO TABS: 800.0000 mg | ORAL_TABLET | Freq: Three times a day (TID) | ORAL | Status: DC

## 2016-02-11 MED ORDER — CYCLOBENZAPRINE HCL 10 MG PO TABS: 10.0000 mg | ORAL_TABLET | Freq: Two times a day (BID) | ORAL | Status: DC | PRN

## 2016-02-11 MED ORDER — OXYCODONE-ACETAMINOPHEN 5-325 MG PO TABS
2.0000 | ORAL_TABLET | Freq: Once | ORAL | Status: AC
  Administered 2016-02-11: 2 via ORAL
  Filled 2016-02-11: qty 2

## 2016-02-11 MED ORDER — IBUPROFEN 800 MG PO TABS
800.0000 mg | ORAL_TABLET | Freq: Once | ORAL | Status: AC
  Administered 2016-02-11: 800 mg via ORAL
  Filled 2016-02-11: qty 1

## 2016-02-11 NOTE — ED Provider Notes (Signed)
CSN: [id number]     Arrival date & time 02/10/16  2241 History   First MD Initiated Contact with Patient 02/10/16 2356     Chief Complaint  Patient presents with  . Back Pain     (Consider location/radiation/quality/duration/timing/severity/associated sxs/prior Treatment) HPI   Patient has PMH of psoriasis, diabetes and asthma presents to the Er with complaints of back pain. He works for [employer] and picked up a really heavy package and felt a pop in his stomach, he had a moderate amount of pain at the time. As he continued to work the pain progressed until now it hurts to change positions. His pain is most relieved when sitting. He does not have any pain, numbness or tingling going down into his legs. He has not had any loss of bowel or bladder control. His pain is left thoracic paraspinal.  PCP: Eloisa Northern, MD Chase Gray is a 30 y.o.  male  ROS: The patient denies diaphoresis, fever, headache, weakness (general or focal), confusion, change of vision,  dysphagia, aphagia, shortness of breath,  abdominal pains, nausea, vomiting, diarrhea, lower extremity swelling, rash, neck pain, chest pain   Past Medical History  Diagnosis Date  . Psoriasis   . Diabetes mellitus without complication (HCC)   . Asthma    Past Surgical History  Procedure Laterality Date  . No past surgeries     Family History  Problem Relation Age of Onset  . Hyperlipidemia Mother   . Diabetes Father    Social History  Substance Use Topics  . Smoking status: Never Smoker   . Smokeless tobacco: Never Used  . Alcohol Use: 0.6 oz/week    1 Standard drinks or equivalent per week    Review of Systems  Review of Systems All other systems negative except as documented in the HPI. All pertinent positives and negatives as reviewed in the HPI.   Allergies  Review of patient's allergies indicates no known allergies.  Home Medications   Prior to Admission medications   Medication Sig Start Date End  Date Taking? Authorizing Provider  albuterol (PROVENTIL HFA;VENTOLIN HFA) 108 (90 Base) MCG/ACT inhaler Inhale 2 puffs into the lungs every 6 (six) hours as needed for wheezing or shortness of breath.   Yes Historical Provider, MD  beclomethasone (QVAR) 80 MCG/ACT inhaler Inhale 2 puffs into the lungs 2 (two) times daily. Rinse mouth/brush teeth after use. 01/31/16  Yes Chelle Jeffery, PA-C  ibuprofen (ADVIL,MOTRIN) 200 MG tablet Take 600 mg by mouth every 6 (six) hours as needed for headache, mild pain or moderate pain.   Yes Historical Provider, MD  chlorpheniramine-HYDROcodone (TUSSIONEX PENNKINETIC ER) 10-8 MG/5ML SUER Take 5 mLs by mouth every 12 (twelve) hours as needed for cough. Patient not taking: Reported on 02/10/2016 01/31/16   Porfirio Oar, PA-C  cyclobenzaprine (FLEXERIL) 10 MG tablet Take 1 tablet (10 mg total) by mouth 2 (two) times daily as needed for muscle spasms. 02/11/16   Laurey Salser Neva Seat, PA-C  ibuprofen (ADVIL,MOTRIN) 800 MG tablet Take 1 tablet (800 mg total) by mouth 3 (three) times daily. 02/11/16   Marlon Pel, PA-C  Spacer/Aero-Holding Chambers (AEROCHAMBER PLUS) inhaler Use as instructed Patient not taking: Reported on 02/10/2016 01/31/16   Porfirio Oar, PA-C  traMADol (ULTRAM) 50 MG tablet Take 1 tablet (50 mg total) by mouth every 6 (six) hours as needed. 02/11/16   Marlon Pel, PA-C   There were no vitals taken for this visit. Physical Exam  Constitutional: He appears well-developed and  well-nourished. No distress.  HENT:  Head: Normocephalic and atraumatic.  Right Ear: Tympanic membrane and ear canal normal.  Left Ear: Tympanic membrane and ear canal normal.  Nose: Nose normal.  Mouth/Throat: Uvula is midline, oropharynx is clear and moist and mucous membranes are normal.  Eyes: Pupils are equal, round, and reactive to light.  Neck: Normal range of motion. Neck supple.  Cardiovascular: Normal rate and regular rhythm.   Pulmonary/Chest: Effort normal.    Abdominal: Soft.  No signs of abdominal distention  Musculoskeletal:       Arms: No LE swelling Symmetrical and physiologic strength to bilateral lower extremities.  Neurosensory function adequate to both legs Skin color is normal. Skin is warm and moist.  No step off deformity appreciated and no midline bony tenderness.  Ambulatory  No crepitus, laceration, effusion, induration, lesions Pedal pulses are symmetrical and palpable bilaterally  Tenderness to palpation of paraspinal thoracic left side and midline of spine  Neurological: He is alert.  Acting at baseline  Skin: Skin is warm and dry. No rash noted.  Nursing note and vitals reviewed.   ED Course  Procedures (including critical care time) Labs Review Labs Reviewed  URINALYSIS, ROUTINE W REFLEX MICROSCOPIC (NOT AT [facility]) - Abnormal; Notable for the following:    Specific Gravity, Urine 1.036 (*)    Bilirubin Urine SMALL (*)    All other components within normal limits    Imaging Review No results found. I have personally reviewed and evaluated these images and lab results as part of my medical decision-making.   EKG Interpretation None      MDM   Final diagnoses:  Muscle strain of left upper back, initial encounter    29 y.o.Chase Gray's  with back pain.   No neurological deficits and normal neuro exam. No loss of bowel or bladder control. No concern for cauda equina at this time base on HPI and physical exam findings. No fever, night sweats, weight loss, h/o cancer, IVDU. The patient can walk with some discomfort.   Patient Plan 1. Medications: NSAIDs and/or muscle relaxer. Cont usual home medications unless otherwise directed. 2. Treatment: rest, drink plenty of fluids, gentle stretching as discussed, alternate ice and heat  3. Follow Up: Please followup with your primary doctor for discussion of your diagnoses and further evaluation after today's visit; if you do not have a primary care  doctor use the resource guide provided to find one  Advised to follow-up with the orthopedist if symptoms do not start to resolve in the next 2-3 days. If develop loss of bowel or urinary control return to the ED as soon as possible for further evaluation. To take the medications as prescribed as they can cause harm if not taken appropriately.   Vital signs are stable at discharge. There were no vitals filed for this visit.  Patient/guardian has voiced understanding and agreed to follow-up with the PCP or specialist.         Marlon Peliffany Waris Rodger, PA-C 02/11/16 [id number]  Derwood KaplanAnkit Nanavati, MD 02/11/16 [phone]

## 2016-02-11 NOTE — Discharge Instructions (Signed)

## 2016-03-21 ENCOUNTER — Other Ambulatory Visit: Payer: Self-pay

## 2016-03-21 DIAGNOSIS — R05 Cough: Secondary | ICD-10-CM

## 2016-03-21 DIAGNOSIS — R059 Cough, unspecified: Secondary | ICD-10-CM

## 2016-03-21 MED ORDER — BECLOMETHASONE DIPROPIONATE 80 MCG/ACT IN AERS: 2.0000 | INHALATION_SPRAY | Freq: Two times a day (BID) | RESPIRATORY_TRACT | Status: DC

## 2017-07-20 IMAGING — CR DG RIBS W/ CHEST 3+V*L*
2 series · 2 of 2 positions shown · non-contrast
Comparison: Chest x-ray of today's date

CLINICAL DATA: Left flank pain, history of asthma, recent coughing

EXAM:
LEFT RIBS AND CHEST - 3+ VIEW

[lao]
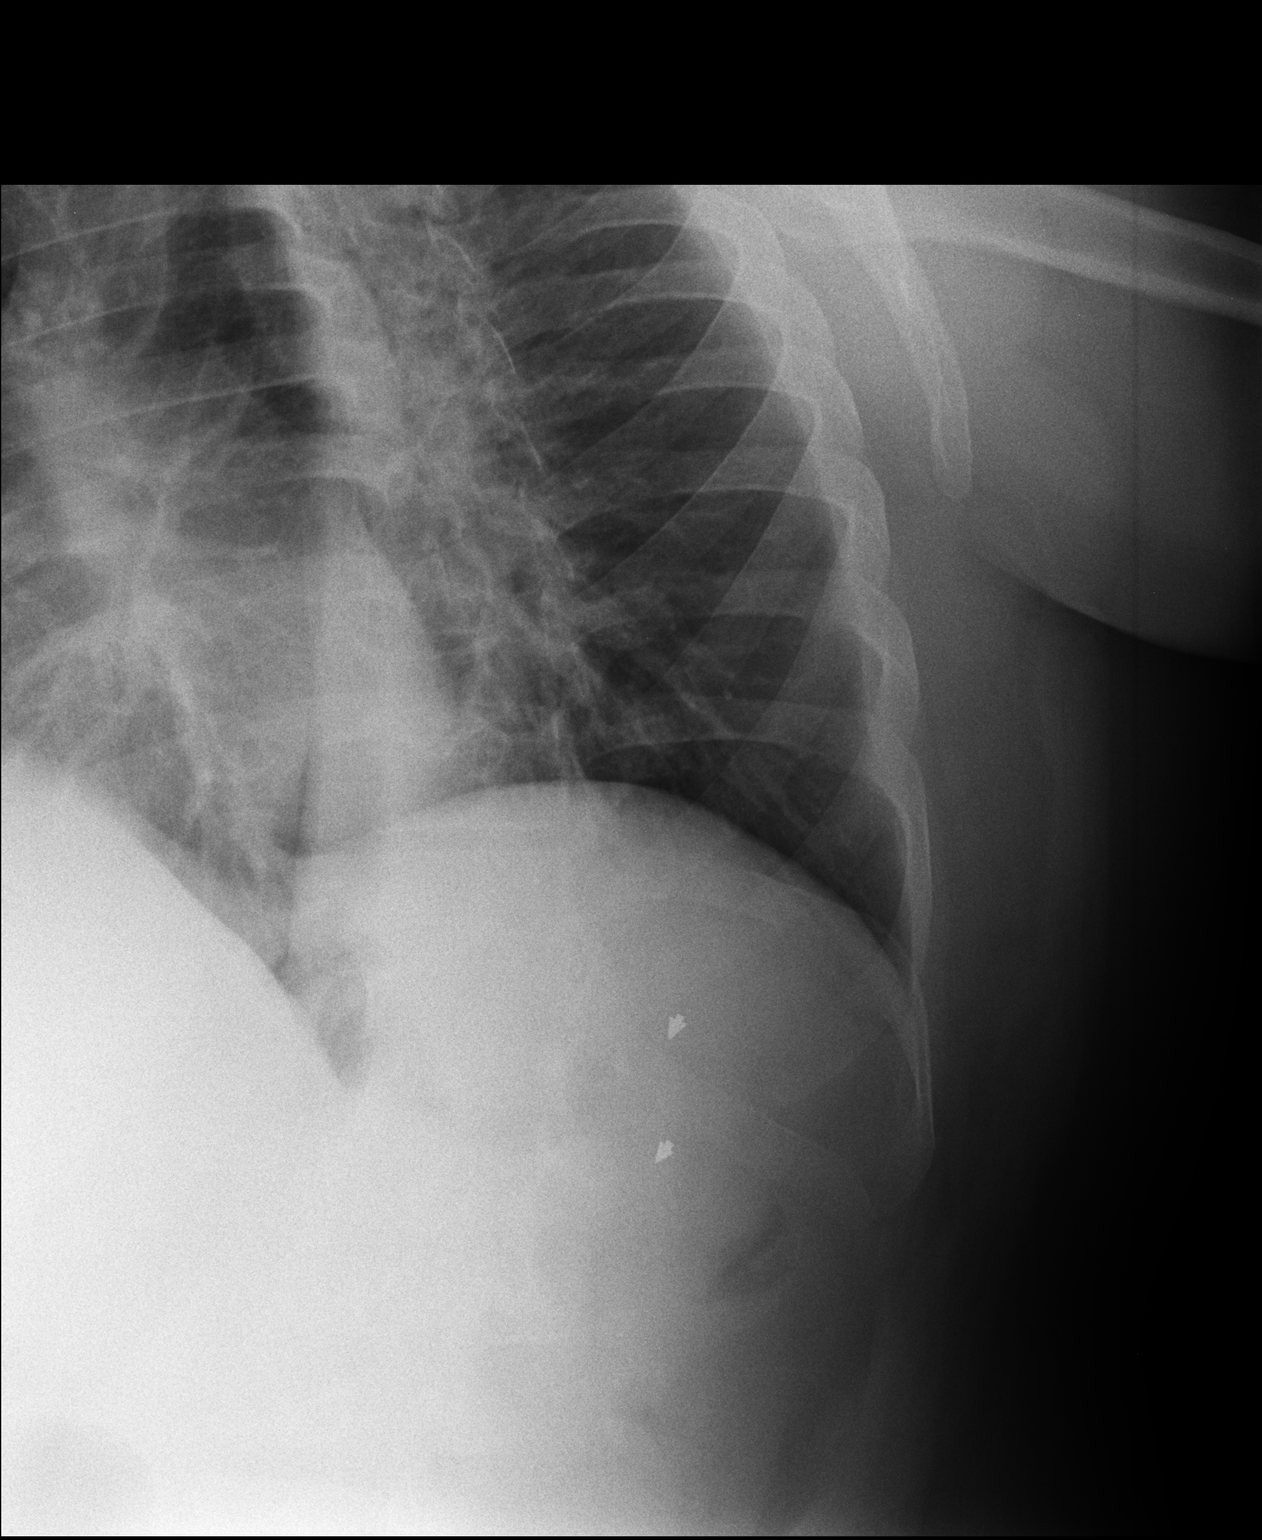

[rao]
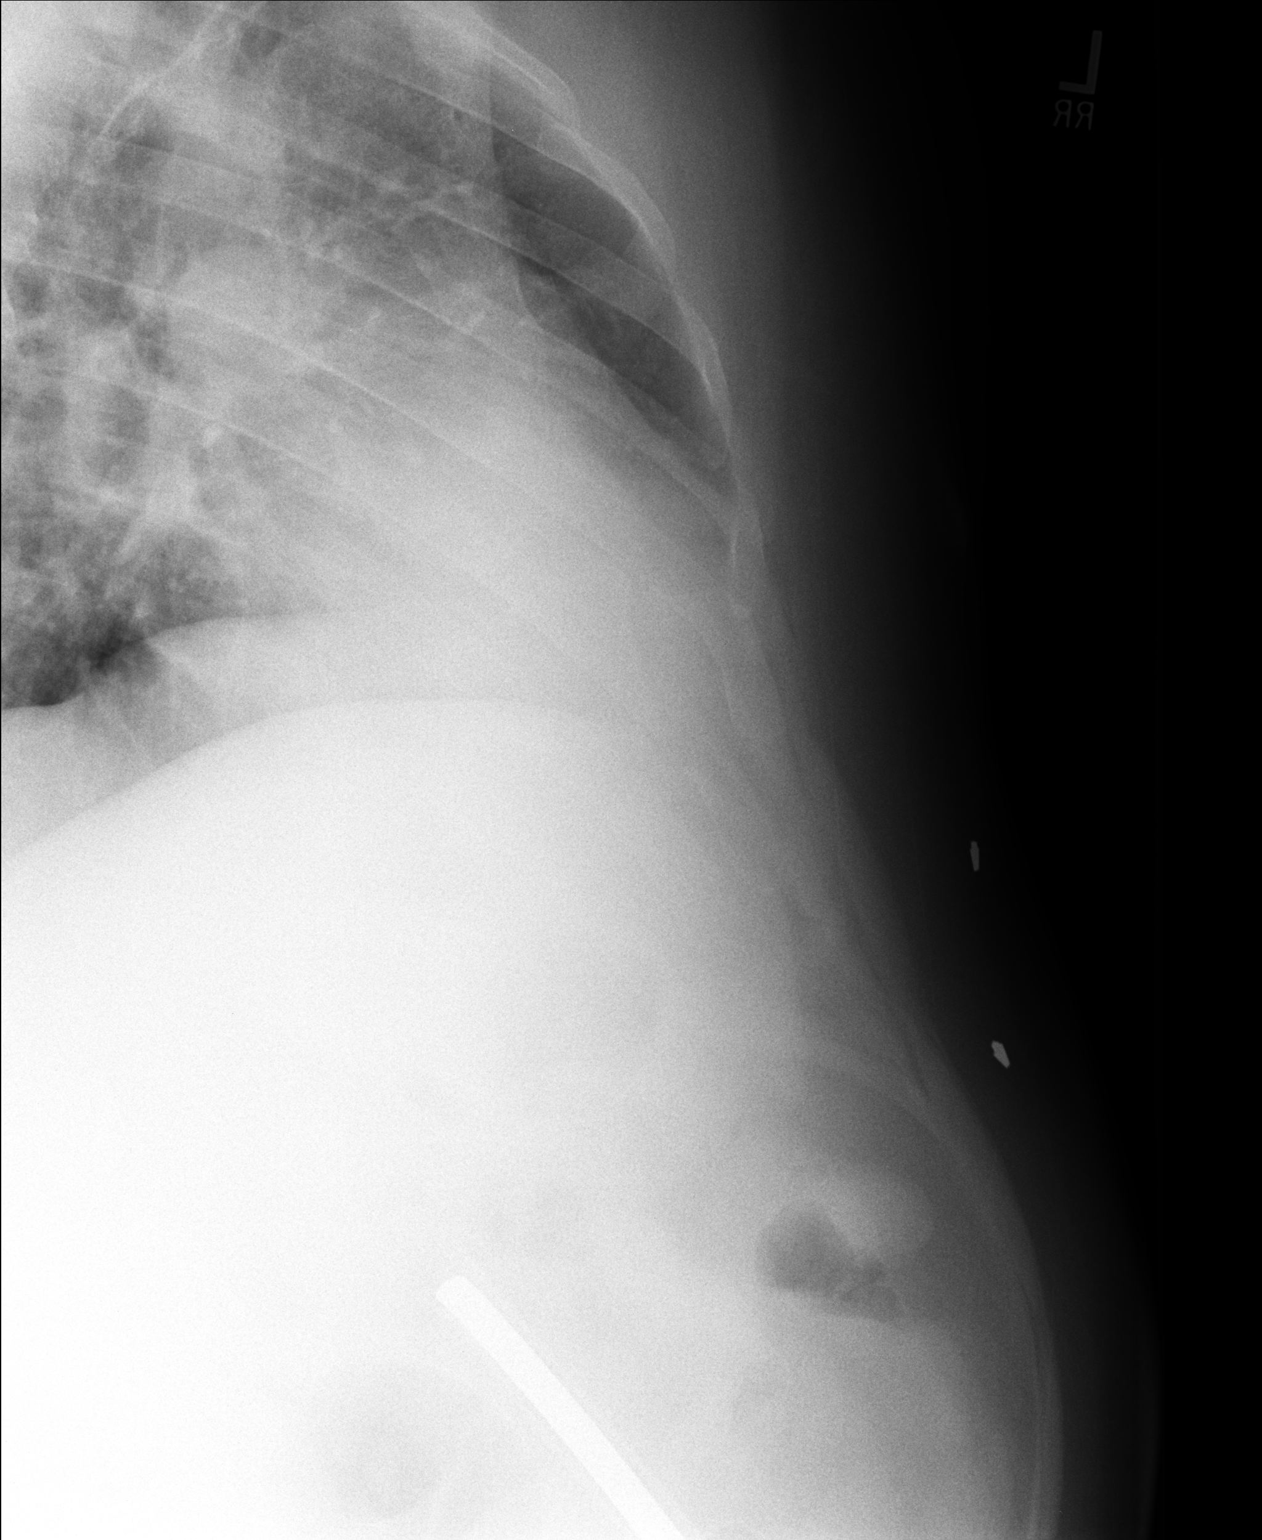

[2 of 2 positions shown; findings below may reference images not displayed]

FINDINGS: The area of symptoms over the left lower lateral ribcage is marked
with 2 radiodense markers. The overlying ribs appear intact. There
is no pleural effusion.
IMPRESSION: No left lower rib fracture is observed.

## 2021-02-16 ENCOUNTER — Ambulatory Visit (HOSPITAL_COMMUNITY)
Admission: EM | Admit: 2021-02-16 | Discharge: 2021-02-16 | Disposition: A | Discharge status: Home / Self Care | Payer: BC Managed Care – PPO | Attending: Internal Medicine | Admitting: Internal Medicine

## 2021-02-16 ENCOUNTER — Encounter (HOSPITAL_COMMUNITY): Payer: Self-pay | Admitting: Emergency Medicine

## 2021-02-16 ENCOUNTER — Other Ambulatory Visit: Payer: Self-pay

## 2021-02-16 ENCOUNTER — Encounter (HOSPITAL_COMMUNITY): Payer: Self-pay

## 2021-02-16 ENCOUNTER — Emergency Department (HOSPITAL_COMMUNITY)
Admission: EM | Admit: 2021-02-16 | Discharge: 2021-02-16 | Disposition: A | Discharge status: Home / Self Care | Payer: BC Managed Care – PPO | Attending: Emergency Medicine | Admitting: Emergency Medicine

## 2021-02-16 DIAGNOSIS — Z5321 Procedure and treatment not carried out due to patient leaving prior to being seen by health care provider: Secondary | ICD-10-CM | POA: Insufficient documentation

## 2021-02-16 DIAGNOSIS — M545 Low back pain, unspecified: Secondary | ICD-10-CM | POA: Diagnosis not present

## 2021-02-16 LAB — URINALYSIS, ROUTINE W REFLEX MICROSCOPIC
Bacteria, UA: NONE SEEN
Bilirubin Urine: NEGATIVE
Glucose, UA: >=500 mg/dL — AB
Hgb urine dipstick: NEGATIVE
Ketones, ur: 5 mg/dL — AB
Nitrite: NEGATIVE
Protein, ur: NEGATIVE mg/dL
Specific Gravity, Urine: 1.028 (ref 1.005–1.030)
pH: 5 (ref 5.0–8.0)

## 2021-02-16 MED ORDER — CYCLOBENZAPRINE HCL 10 MG PO TABS: 10.0000 mg | ORAL_TABLET | Freq: Two times a day (BID) | ORAL | 0 refills | Status: DC | PRN

## 2021-02-16 MED ORDER — DICLOFENAC SODIUM 75 MG PO TBEC: 75.0000 mg | DELAYED_RELEASE_TABLET | Freq: Two times a day (BID) | ORAL | 0 refills | Status: DC

## 2021-02-16 NOTE — ED Notes (Signed)
Pt very agitated because of the wait time. He stated "he was leaving and going to urgent care". Moving Pt OTF.

## 2021-02-16 NOTE — ED Provider Notes (Signed)
Emergency Medicine Provider Triage Evaluation Note  Chase Gray , a 35 y.o. male  was evaluated in triage.  Pt complains of low back pain. It started this afternoon. Denies any trauma. Started acutely, does not radiate. Worse along lower lumbar spine. Aggravated by movement. H/o kidney stones. No fever, chills, lower leg numbness, urinary incontinence, dysuria. No previous back injuries  Review of Systems  Positive: Low back pain Negative: Fever, nausea, numbness, urinary incontinence, dysuria  Physical Exam  There were no vitals taken for this visit. Gen:   Awake, no distress   Resp:  Normal effort  MSK:   Moves extremities without difficulty. Eqaul strength bilateral to LEs. No bony tenderness to spine. Sensation in tact. ROM in tact. Ambulatory Other:    Medical Decision Making  Medically screening exam initiated at 1:32 PM.  Appropriate orders placed.  Chase Gray Cashaw was informed that the remainder of the evaluation will be completed by another provider, this initial triage assessment does not replace that evaluation, and the importance of remaining in the ED until their evaluation is complete.  Low back pain   Theron Arista, PA-C 02/16/21 1338    Koleen Distance, MD 02/16/21 1359

## 2021-02-16 NOTE — ED Triage Notes (Signed)
Pt states he was getting ready for work and started stretching, then began to have really bad lower back pain. No hx of back surgery. Denies weakness/numbness into legs.

## 2021-02-16 NOTE — ED Triage Notes (Signed)
Pt in with c/o mid to low back pain that he noticed this morning when he was stretching  States he was bending over to stretch and when he stood back up he felt his muscles get tight and has been having pain ever since

## 2021-02-16 NOTE — Discharge Instructions (Addendum)
It is OK to add Tylenol up to 1000 mg with current medications Ice the areas of pain for 2 0 min every 2 hours today and tomorrow, then after that use heat and do stretches right after.

## 2021-05-04 ENCOUNTER — Encounter (HOSPITAL_COMMUNITY): Payer: Self-pay | Admitting: *Deleted

## 2021-05-04 ENCOUNTER — Other Ambulatory Visit: Payer: Self-pay

## 2021-05-04 ENCOUNTER — Ambulatory Visit (HOSPITAL_COMMUNITY)
Admission: EM | Admit: 2021-05-04 | Discharge: 2021-05-04 | Disposition: A | Discharge status: Home / Self Care | Payer: BC Managed Care – PPO | Attending: Internal Medicine | Admitting: Internal Medicine

## 2021-05-04 DIAGNOSIS — M546 Pain in thoracic spine: Secondary | ICD-10-CM | POA: Diagnosis present

## 2021-05-04 DIAGNOSIS — S29012A Strain of muscle and tendon of back wall of thorax, initial encounter: Secondary | ICD-10-CM | POA: Insufficient documentation

## 2021-05-04 LAB — COMPREHENSIVE METABOLIC PANEL
ALT: 68 U/L — ABNORMAL HIGH (ref 0–44)
AST: 41 U/L (ref 15–41)
Albumin: 4.5 g/dL (ref 3.5–5.0)
Alkaline Phosphatase: 74 U/L (ref 38–126)
Anion gap: 10 (ref 5–15)
BUN: 12 mg/dL (ref 6–20)
CO2: 25 mmol/L (ref 22–32)
Calcium: 9.9 mg/dL (ref 8.9–10.3)
Chloride: 98 mmol/L (ref 98–111)
Creatinine, Ser: 0.76 mg/dL (ref 0.61–1.24)
GFR, Estimated: >60 mL/min (ref >60)
Glucose, Bld: 274 mg/dL — ABNORMAL HIGH (ref 70–99)
Potassium: 4.3 mmol/L (ref 3.5–5.1)
Sodium: 133 mmol/L — ABNORMAL LOW (ref 135–145)
Total Bilirubin: 1.3 mg/dL — ABNORMAL HIGH (ref 0.3–1.2)
Total Protein: 8.4 g/dL — ABNORMAL HIGH (ref 6.5–8.1)

## 2021-05-04 MED ORDER — PREDNISONE 10 MG PO TABS: 20.0000 mg | ORAL_TABLET | Freq: Every day | ORAL | 0 refills | Status: AC

## 2021-05-04 NOTE — Discharge Instructions (Signed)
Please call provided contact information for [facility] today for further evaluation and management.  Please monitor your blood sugar 2-3 times daily while taking steroid.  Please stop steroid if blood sugar reaches 250 or above.  Please go to the hospital if pain significantly worsens or if you develop bowel or bladder incontinence.  May use ice application to affected area of pain.

## 2021-05-04 NOTE — ED Triage Notes (Signed)
PT reports taking 9000 mg of Tylenol yesterday with out relief of back pain.

## 2021-05-04 NOTE — ED Provider Notes (Signed)
[facility]    CSN: [id number] Arrival date & time: 05/04/21  2130      History   Chief Complaint Chief Complaint  Patient presents with   Back Pain    HPI Chase Gray is a 35 y.o. male.   Patient presents to urgent care for further evaluation of bilateral upper back pain.  Patient states the pain has been present since urgent care visit in May.  Denies any injury but states that he does work for [employer] and lifts large items throughout the day on a daily basis.  Patient was prescribed muscle relaxer and diclofenac at that visit, and patient has taken with no relief of symptoms.  Patient states that the muscle relaxer only made him sleepy and he was afraid of addiction, so he is no longer taking them.  Has taken 9000 mg of Tylenol in the past 24 hours.  Denies any abdominal pain.  Denies any numbness or tingling in legs.  Denies any urinary burning, urgency,or urinary or bowel incontinence.  States that he cannot take NSAIDs because they cause nosebleeds.   Back Pain  Past Medical History:  Diagnosis Date   Asthma    Diabetes mellitus without complication (HCC)    Psoriasis     Patient Active Problem List   Diagnosis Date Noted   Childhood asthma 01/31/2016   Psoriasis 01/31/2016   Type 2 diabetes mellitus (HCC) 01/31/2016    Past Surgical History:  Procedure Laterality Date   NO PAST SURGERIES         Home Medications    Prior to Admission medications   Medication Sig Start Date End Date Taking? Authorizing Provider  predniSONE (DELTASONE) 10 MG tablet Take 2 tablets (20 mg total) by mouth daily for 5 days. 05/04/21 05/09/21 Yes Lance Muss, FNP  albuterol (PROVENTIL HFA;VENTOLIN HFA) 108 (90 Base) MCG/ACT inhaler Inhale 2 puffs into the lungs every 6 (six) hours as needed for wheezing or shortness of breath.    [provider]  beclomethasone (QVAR) 80 MCG/ACT inhaler Inhale 2 puffs into the lungs 2 (two) times daily. Rinse  mouth/brush teeth after use. 03/21/16   Porfirio Oar, PA  diclofenac (VOLTAREN) 75 MG EC tablet Take 1 tablet (75 mg total) by mouth 2 (two) times daily. 02/16/21   Rodriguez-Southworth, Nettie Elm, PA-C    Family History Family History  Problem Relation Age of Onset   Hyperlipidemia Mother    Diabetes Father     Social History Social History   Tobacco Use   Smoking status: Never   Smokeless tobacco: Never  Substance Use Topics   Alcohol use: Yes    Alcohol/week: 1.0 standard drink    Types: 1 Standard drinks or equivalent per week   Drug use: No     Allergies   Patient has no known allergies.   Review of Systems Review of Systems  Musculoskeletal:  Positive for back pain.  Per HPI  Physical Exam Triage Vital Signs ED Triage Vitals  Enc Vitals Group     BP 05/04/21 1059 128/79     Pulse Rate 05/04/21 1059 (!) 101     Resp 05/04/21 1059 20     Temp 05/04/21 1059 98.8 F (37.1 C)     Temp src --      SpO2 05/04/21 1059 98 %     Weight --      Height --      Head Circumference --  Peak Flow --      Pain Score 05/04/21 1100 10     Pain Loc --      Pain Edu? --      Excl. in [place]? --    No data found.  Updated Vital Signs BP 128/79   Pulse (!) 101   Temp 98.8 F (37.1 C)   Resp 20   SpO2 98%   Visual Acuity Right Eye Distance:   Left Eye Distance:   Bilateral Distance:    Right Eye Near:   Left Eye Near:    Bilateral Near:     Physical Exam Constitutional:      Appearance: Normal appearance.  HENT:     Head: Normocephalic and atraumatic.  Eyes:     Extraocular Movements: Extraocular movements intact.     Conjunctiva/sclera: Conjunctivae normal.  Pulmonary:     Effort: Pulmonary effort is normal.  Musculoskeletal:     Cervical back: Normal.     Thoracic back: Tenderness present.     Lumbar back: Normal.     Comments: Tenderness to palpation directly over spine approximately T8-T10.  Also has bilateral tenderness to palpation to musculature  directly to side of thoracic spine at these areas.  Neurological:     General: No focal deficit present.     Mental Status: He is alert and oriented to person, place, and time. Mental status is at baseline.  Psychiatric:        Mood and Affect: Mood normal.        Behavior: Behavior normal.        Thought Content: Thought content normal.        Judgment: Judgment normal.     UC Treatments / Results  Labs (all labs ordered are listed, but only abnormal results are displayed) Labs Reviewed  COMPREHENSIVE METABOLIC PANEL    EKG   Radiology No results found.  Procedures Procedures (including critical care time)  Medications Ordered in UC Medications - No data to display  Initial Impression / Assessment and Plan / UC Course  I have reviewed the triage vital signs and the nursing notes.  Pertinent labs & imaging results that were available during my care of the patient were reviewed by me and considered in my medical decision making (see chart for details).     Patient will need to follow-up with orthopedic specialist for further evaluation and management of back pain due to duration of back pain. Low dose Prednisone x5 days prescribed for patient, but patient was advised of the importance of monitoring blood glucose 2-3 times daily.  Patient was advised to stop taking steroid if blood glucose becomes elevated to 250 or above.  Patient voiced understanding.  Patient to contact provided orthopedic specialist today to set up appointment for further evaluation.  X-ray is warranted due to area of tenderness to palpation directly over thoracic spine.  Unable to complete x-ray today due to malfunction of x-ray machine.  Will defer to orthopedist.  Patient also advised that he may go to the hospital for further evaluation if pain significantly worsens for x-ray.  May also use ice application to affected area.  Will check CMP to evaluate for any acute liver damage due to patient taking 9000  mg of Tylenol in the past 24 hours.  Patient advised to not take any more Tylenol.  Patient advised to go to the hospital if pain significantly worsens.  Discussed strict return precautions. Patient verbalized understanding and is agreeable with plan.  Final Clinical Impressions(s) / UC Diagnoses   Final diagnoses:  Acute bilateral thoracic back pain  Strain of thoracic back region     Discharge Instructions      Please call provided contact information for [facility] today for further evaluation and management.  Please monitor your blood sugar 2-3 times daily while taking steroid.  Please stop steroid if blood sugar reaches 250 or above.  Please go to the hospital if pain significantly worsens or if you develop bowel or bladder incontinence.  May use ice application to affected area of pain.     ED Prescriptions     Medication Sig Dispense Auth. Provider   predniSONE (DELTASONE) 10 MG tablet Take 2 tablets (20 mg total) by mouth daily for 5 days. 10 tablet Lance Muss, FNP      PDMP not reviewed this encounter.   Lance Muss, FNP 05/04/21 [phone]

## 2021-09-20 ENCOUNTER — Ambulatory Visit (HOSPITAL_COMMUNITY)
Admission: EM | Admit: 2021-09-20 | Discharge: 2021-09-20 | Disposition: A | Discharge status: Home / Self Care | Payer: BC Managed Care – PPO | Attending: Family | Admitting: Family

## 2021-09-20 ENCOUNTER — Other Ambulatory Visit: Payer: Self-pay

## 2021-09-20 ENCOUNTER — Encounter (HOSPITAL_COMMUNITY): Payer: Self-pay | Admitting: Emergency Medicine

## 2021-09-20 DIAGNOSIS — J45909 Unspecified asthma, uncomplicated: Secondary | ICD-10-CM | POA: Insufficient documentation

## 2021-09-20 DIAGNOSIS — Z20822 Contact with and (suspected) exposure to covid-19: Secondary | ICD-10-CM | POA: Insufficient documentation

## 2021-09-20 DIAGNOSIS — R051 Acute cough: Secondary | ICD-10-CM | POA: Insufficient documentation

## 2021-09-20 DIAGNOSIS — J3489 Other specified disorders of nose and nasal sinuses: Secondary | ICD-10-CM | POA: Diagnosis present

## 2021-09-20 DIAGNOSIS — E119 Type 2 diabetes mellitus without complications: Secondary | ICD-10-CM | POA: Diagnosis not present

## 2021-09-20 DIAGNOSIS — J0100 Acute maxillary sinusitis, unspecified: Secondary | ICD-10-CM | POA: Diagnosis present

## 2021-09-20 DIAGNOSIS — L409 Psoriasis, unspecified: Secondary | ICD-10-CM | POA: Insufficient documentation

## 2021-09-20 MED ORDER — AMOXICILLIN-POT CLAVULANATE 875-125 MG PO TABS: 1.0000 | ORAL_TABLET | Freq: Two times a day (BID) | ORAL | 0 refills | Status: AC

## 2021-09-20 MED ORDER — BENZONATATE 100 MG PO CAPS: 200.0000 mg | ORAL_CAPSULE | Freq: Three times a day (TID) | ORAL | 0 refills | Status: DC | PRN

## 2021-09-20 MED ORDER — PREDNISONE 20 MG PO TABS: 40.0000 mg | ORAL_TABLET | Freq: Every day | ORAL | 0 refills | Status: AC

## 2021-09-20 NOTE — ED Provider Notes (Signed)
[facility]    CSN: [id number] Arrival date & time: 09/20/21  2440      History   Chief Complaint Chief Complaint  Patient presents with   Cough   Nasal Congestion    HPI JEROMY BORCHERDING is a 35 y.o. male.   34 year old male presents with cough for over 2 weeks. Last week developed more nasal congestion, sinus pressure, ear pressure, and sore throat. Yesterday developed chills and possible fever. Also has noticed loss of smell and slight diarrhea. No nausea or vomiting. Daughter had strep 2 weeks ago. Has history of occasional sinus infections but never has lost his sense of smell before. Has used a Nettipot, OTC cough medication and Theraflu with minimal relief. Has also used Albuterol nebulizer with minimal relief. No other chronic health issues except asthma, psoriasis and type 2 DM. Takes no daily medication.   The history is provided by the patient.   Past Medical History:  Diagnosis Date   Asthma    Diabetes mellitus without complication (HCC)    Psoriasis     Patient Active Problem List   Diagnosis Date Noted   Childhood asthma 01/31/2016   Psoriasis 01/31/2016   Type 2 diabetes mellitus (HCC) 01/31/2016    Past Surgical History:  Procedure Laterality Date   NO PAST SURGERIES         Home Medications    Prior to Admission medications   Medication Sig Start Date End Date Taking? Authorizing Provider  amoxicillin-clavulanate (AUGMENTIN) 875-125 MG tablet Take 1 tablet by mouth every 12 (twelve) hours for 7 days. 09/20/21 09/27/21 Yes Amear Strojny, Ali Lowe, NP  benzonatate (TESSALON) 100 MG capsule Take 2 capsules (200 mg total) by mouth every 8 (eight) hours as needed for cough. 09/20/21  Yes Dawnielle Christiana, Ali Lowe, NP  predniSONE (DELTASONE) 20 MG tablet Take 2 tablets (40 mg total) by mouth daily for 5 days. 09/20/21 09/25/21 Yes Kennette Cuthrell, Ali Lowe, NP  albuterol (PROVENTIL HFA;VENTOLIN HFA) 108 (90 Base) MCG/ACT inhaler Inhale 2 puffs into the lungs  every 6 (six) hours as needed for wheezing or shortness of breath.    [provider]    Family History Family History  Problem Relation Age of Onset   Hyperlipidemia Mother    Diabetes Father     Social History Social History   Tobacco Use   Smoking status: Never   Smokeless tobacco: Never  Substance Use Topics   Alcohol use: Yes    Alcohol/week: 1.0 standard drink    Types: 1 Standard drinks or equivalent per week   Drug use: No     Allergies   Codeine   Review of Systems Review of Systems  Constitutional:  Positive for appetite change, chills, fatigue and fever.  HENT:  Positive for congestion, ear pain, sinus pressure, sinus pain and sore throat. Negative for ear discharge, hearing loss, mouth sores, nosebleeds, postnasal drip, rhinorrhea and trouble swallowing.   Eyes:  Negative for pain, discharge, redness and itching.  Respiratory:  Positive for cough, chest tightness and wheezing.   Gastrointestinal:  Positive for diarrhea. Negative for nausea and vomiting.  Musculoskeletal:  Negative for arthralgias, myalgias, neck pain and neck stiffness.  Skin:  Negative for color change and rash.  Allergic/Immunologic: Negative for environmental allergies, food allergies and immunocompromised state.  Neurological:  Positive for headaches. Negative for dizziness, tremors, seizures, syncope and numbness.  Hematological:  Negative for adenopathy. Does not bruise/bleed easily.    Physical Exam Triage  Vital Signs ED Triage Vitals  Enc Vitals Group     BP 09/20/21 1032 130/87     Pulse Rate 09/20/21 1032 90     Resp 09/20/21 1032 18     Temp 09/20/21 1032 98.5 F (36.9 C)     Temp Source 09/20/21 1032 Oral     SpO2 09/20/21 1032 96 %     Weight --      Height --      Head Circumference --      Peak Flow --      Pain Score 09/20/21 1031 9     Pain Loc --      Pain Edu? --      Excl. in [place]? --    No data found.  Updated Vital Signs BP 130/87 (BP Location:  Left Arm)   Pulse 90   Temp 98.5 F (36.9 C) (Oral)   Resp 18   SpO2 96%   Visual Acuity Right Eye Distance:   Left Eye Distance:   Bilateral Distance:    Right Eye Near:   Left Eye Near:    Bilateral Near:     Physical Exam Vitals and nursing note reviewed.  Constitutional:      General: He is awake. He is not in acute distress.    Appearance: He is well-developed and well-groomed. He is ill-appearing.     Comments: He is sitting on the exam table in no acute distress but appears tired and ill.   HENT:     Head: Normocephalic and atraumatic.     Right Ear: Hearing, ear canal and external ear normal. A middle ear effusion is present. Tympanic membrane is bulging. Tympanic membrane is not injected or erythematous.     Left Ear: Hearing, ear canal and external ear normal. A middle ear effusion is present. Tympanic membrane is bulging. Tympanic membrane is not injected or erythematous.     Nose: Mucosal edema and congestion present.     Right Sinus: Maxillary sinus tenderness present. No frontal sinus tenderness.     Left Sinus: Maxillary sinus tenderness present. No frontal sinus tenderness.     Mouth/Throat:     Lips: Pink.     Mouth: Mucous membranes are moist.     Pharynx: Uvula midline. Oropharyngeal exudate (yellow) and posterior oropharyngeal erythema present. No pharyngeal swelling or uvula swelling.  Eyes:     Extraocular Movements: Extraocular movements intact.     Conjunctiva/sclera: Conjunctivae normal.  Cardiovascular:     Rate and Rhythm: Normal rate and regular rhythm.     Heart sounds: Normal heart sounds. No murmur heard. Pulmonary:     Effort: Pulmonary effort is normal. No respiratory distress.     Breath sounds: Normal air entry. No decreased air movement. Examination of the right-upper field reveals wheezing and rhonchi. Examination of the left-upper field reveals wheezing and rhonchi. Wheezing and rhonchi present. No decreased breath sounds or rales.      Comments: Coarse breath sounds in upper lobes when coughing.  Musculoskeletal:     Cervical back: Normal range of motion and neck supple.  Lymphadenopathy:     Cervical: No cervical adenopathy.  Skin:    General: Skin is warm and dry.     Findings: No rash.  Neurological:     General: No focal deficit present.     Mental Status: He is alert and oriented to person, place, and time.  Psychiatric:        Mood and Affect:  Mood normal.        Behavior: Behavior normal. Behavior is cooperative.        Thought Content: Thought content normal.        Judgment: Judgment normal.     UC Treatments / Results  Labs (all labs ordered are listed, but only abnormal results are displayed) Labs Reviewed  SARS CORONAVIRUS 2 (TAT 6-24 HRS)    EKG   Radiology No results found.  Procedures Procedures (including critical care time)  Medications Ordered in UC Medications - No data to display  Initial Impression / Assessment and Plan / UC Course  I have reviewed the triage vital signs and the nursing notes.  Pertinent labs & imaging results that were available during my care of the patient were reviewed by me and considered in my medical decision making (see chart for details).     Reviewed that he appears to have another sinus infection. Since symptoms have been occurring for over 2 weeks, will treat for possible bacterial infection. Will test for COVID 19 but most likely will not alter treatment plan since symptoms have been continuing for a number of days and I feel he still has developed a secondary bacterial infection. Recommend start Augmentin 875mg  twice a day as directed. Take Prednisone 40mg  daily for 5 days to help with sinus pressure and chest inflammation. Discussed that this can raise his blood sugar levels so continue to monitor closely. May use Tessalon cough pills 200mg  every 8 hours as needed. Continue to push fluids to help loosen up mucus in sinuses and chest. May continue  Albuterol nebulizer every 4 to 6 hours as needed. Note written for work. Follow-up pending COVID 19 test results and in 4 to 5 days if not improving.    Final Clinical Impressions(s) / UC Diagnoses   Final diagnoses:  Acute non-recurrent maxillary sinusitis  Sinus pressure  Acute cough     Discharge Instructions      Recommend start Augmentin 875mg  twice a day for 7 days. Start Prednisone 40mg  daily for 5 days to help with inflammation and cough. May also take Tessalon cough pills 200mg  (2 pills) every 8 hours as needed. Continue to increase fluids to help loosen up mucus in sinuses and chest. May continue Albuterol every 4 to 6 hours as needed. Follow-up pending COVID 19 test result and in 4 to 5 days if not improving.     ED Prescriptions     Medication Sig Dispense Auth. Provider   amoxicillin-clavulanate (AUGMENTIN) 875-125 MG tablet Take 1 tablet by mouth every 12 (twelve) hours for 7 days. 14 tablet Katy Apo, NP   predniSONE (DELTASONE) 20 MG tablet Take 2 tablets (40 mg total) by mouth daily for 5 days. 10 tablet Katy Apo, NP   benzonatate (TESSALON) 100 MG capsule Take 2 capsules (200 mg total) by mouth every 8 (eight) hours as needed for cough. 30 capsule Katrin Grabel, Nicholes Stairs, NP      PDMP not reviewed this encounter.   Katy Apo, NP 09/20/21 [phone]

## 2021-09-20 NOTE — Discharge Instructions (Addendum)
Recommend start Augmentin 875mg  twice a day for 7 days. Start Prednisone 40mg  daily for 5 days to help with inflammation and cough. May also take Tessalon cough pills 200mg  (2 pills) every 8 hours as needed. Continue to increase fluids to help loosen up mucus in sinuses and chest. May continue Albuterol every 4 to 6 hours as needed. Follow-up pending COVID 19 test result and in 4 to 5 days if not improving.

## 2021-09-20 NOTE — ED Triage Notes (Signed)
Had cough since before thanksgiving that has become productive. Pt reports congestion and sore throat and ear pressure for week.

## 2021-09-21 LAB — SARS CORONAVIRUS 2 (TAT 6-24 HRS): SARS Coronavirus 2: NEGATIVE

## 2023-02-12 ENCOUNTER — Ambulatory Visit (HOSPITAL_COMMUNITY)
Admission: EM | Admit: 2023-02-12 | Discharge: 2023-02-12 | Disposition: A | Discharge status: Home / Self Care | Payer: BC Managed Care – PPO | Attending: Physician Assistant | Admitting: Physician Assistant

## 2023-02-12 ENCOUNTER — Ambulatory Visit (INDEPENDENT_AMBULATORY_CARE_PROVIDER_SITE_OTHER): Payer: BC Managed Care – PPO

## 2023-02-12 ENCOUNTER — Encounter (HOSPITAL_COMMUNITY): Payer: Self-pay | Admitting: *Deleted

## 2023-02-12 ENCOUNTER — Other Ambulatory Visit: Payer: Self-pay

## 2023-02-12 DIAGNOSIS — S63501A Unspecified sprain of right wrist, initial encounter: Secondary | ICD-10-CM

## 2023-02-12 DIAGNOSIS — M25531 Pain in right wrist: Secondary | ICD-10-CM | POA: Diagnosis not present

## 2023-02-12 MED ORDER — HYDROCODONE-ACETAMINOPHEN 5-325 MG PO TABS: 0.5000 | ORAL_TABLET | Freq: Every evening | ORAL | 0 refills | Status: AC | PRN

## 2023-02-12 MED ORDER — DICLOFENAC SODIUM 1 % EX GEL: 4.0000 g | Freq: Three times a day (TID) | CUTANEOUS | 0 refills | Status: DC

## 2023-02-12 NOTE — Discharge Instructions (Signed)
Your x-ray did not show any evidence of a fracture which is great news.  Use the brace for comfort and support.  Apply diclofenac gel up to 3 times a day.  Take acetaminophen/Tylenol during the day.  I have called in 3 doses of hydrocodone to be used at night.  This will make you sleepy so do not drive or drink alcohol while taking it.  If your symptoms or not improving quickly please follow-up with orthopedics; call to schedule an appointment.  If anything worsens and you have increasing pain, decreased range of motion in the wrist, numbness or tingling in the hand you should be seen immediately.
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
## 2023-10-15 ENCOUNTER — Ambulatory Visit
Admission: EM | Admit: 2023-10-15 | Discharge: 2023-10-15 | Disposition: A | Discharge status: Home / Self Care | Payer: BC Managed Care – PPO

## 2023-10-15 DIAGNOSIS — E119 Type 2 diabetes mellitus without complications: Secondary | ICD-10-CM | POA: Insufficient documentation

## 2023-10-15 DIAGNOSIS — F17200 Nicotine dependence, unspecified, uncomplicated: Secondary | ICD-10-CM | POA: Diagnosis not present

## 2023-10-15 DIAGNOSIS — Z758 Other problems related to medical facilities and other health care: Secondary | ICD-10-CM | POA: Diagnosis not present

## 2023-10-15 DIAGNOSIS — J0100 Acute maxillary sinusitis, unspecified: Secondary | ICD-10-CM | POA: Diagnosis not present

## 2023-10-15 DIAGNOSIS — E1165 Type 2 diabetes mellitus with hyperglycemia: Secondary | ICD-10-CM | POA: Diagnosis not present

## 2023-10-15 LAB — POCT RAPID STREP A (OFFICE): Rapid Strep A Screen: NEGATIVE

## 2023-10-15 LAB — POCT FASTING CBG KUC MANUAL ENTRY: POCT Glucose (KUC): 245 mg/dL — AB (ref 70–99)

## 2023-10-15 MED ORDER — AMOXICILLIN-POT CLAVULANATE 875-125 MG PO TABS: 1.0000 | ORAL_TABLET | Freq: Two times a day (BID) | ORAL | 0 refills | Status: DC

## 2023-10-15 NOTE — Discharge Instructions (Addendum)
Please go to ER for further evaluation of uncontrolled diabetes, inability to urinate, most likely you will need labs and fluids.   Your strep is negative, your BS is 245

## 2023-10-15 NOTE — ED Notes (Signed)
Patient is being discharged from the Urgent Care and sent to the Emergency Department via Private Vehicle (Self) . Per Provider Para March), patient is in need of higher level of care due to Uncontrolled DM. Patient is aware and verbalizes understanding of plan of care.  Vitals:   10/15/23 1334 10/15/23 1336  BP: 124/83   Pulse: (!) 110 (!) 108  Resp: 20   Temp: 99 F (37.2 C)   SpO2: 99%

## 2023-10-15 NOTE — ED Provider Notes (Signed)
[facility]    CSN: [id number] Arrival date & time: 10/15/23  1315      History   Chief Complaint Chief Complaint  Patient presents with   Sinus Problem   Dizziness   Sore Throat    HPI Chase Gray is a 37 y.o. male.   37 year old male pt, presents to urgent care for evaluation of worsening sinus congestion, sore throat, dizziness, sob and cough x 3 weeks. Recently exposed to strep throat(daughter), pt states he recently started smoking cigars 4 months prior, pt states he "feel dehydrated and haven't peed in over 14 hours.   Pt states he has a past hx of diabetes was diagnosed and started on insulin and metformin, hospitalized in 2015 for 4 days. Pt states he does not take any medication for diabetes and only reason his blood sugar is 245 is because he "ate a bunch of sugar last few days", doesn't check BS at home.   The history is provided by the patient. No language interpreter was used.    Past Medical History:  Diagnosis Date   Asthma    Diabetes mellitus without complication (HCC)    Psoriasis     Patient Active Problem List   Diagnosis Date Noted   Diabetes mellitus (HCC) 10/15/2023   Smoker 10/15/2023   Acute maxillary sinusitis 10/15/2023   Does not have primary care provider 10/15/2023   Childhood asthma 01/31/2016   Psoriasis 01/31/2016   Uncontrolled type 2 diabetes mellitus with hyperglycemia (HCC) 01/31/2016    Past Surgical History:  Procedure Laterality Date   NO PAST SURGERIES         Home Medications    Prior to Admission medications   Medication Sig Start Date End Date Taking? Authorizing Provider  amoxicillin-clavulanate (AUGMENTIN) 875-125 MG tablet Take 1 tablet by mouth every 12 (twelve) hours. 10/15/23  Yes Jacson Rapaport, Para March, NP  calcipotriene-betamethasone (TACLONEX) ointment Apply 1 Application topically daily. 06/09/21  Yes [provider]  fluticasone (FLONASE) 50 MCG/ACT nasal spray Place 1  spray into both nostrils daily. 11/07/16  Yes [provider]  HYDROcodone bit-homatropine (HYCODAN) 5-1.5 MG/5ML syrup Take 5 mLs by mouth every 6 (six) hours as needed. 01/24/16  Yes [provider]  loperamide (IMODIUM) 2 MG capsule Take 2 mg by mouth as needed. 11/19/18  Yes [provider]  promethazine (PHENERGAN) 12.5 MG tablet Take 12.5 mg by mouth every 6 (six) hours as needed. 11/19/18  Yes [provider]  UNABLE TO FIND Med Name: "multi-symptom cold".   Yes [provider]  albuterol (PROVENTIL HFA;VENTOLIN HFA) 108 (90 Base) MCG/ACT inhaler Inhale 2 puffs into the lungs every 6 (six) hours as needed for wheezing or shortness of breath.    [provider]  benzonatate (TESSALON) 100 MG capsule Take 2 capsules (200 mg total) by mouth every 8 (eight) hours as needed for cough. 09/20/21   Sudie Grumbling, NP  diclofenac Sodium (VOLTAREN) 1 % GEL Apply 4 g topically in the morning, at noon, and at bedtime. 02/12/23   Raspet, Noberto Retort, PA-C    Family History Family History  Problem Relation Age of Onset   Hyperlipidemia Mother    Diabetes Father     Social History Social History   Tobacco Use   Smoking status: Some Days    Types: Cigars    Passive exposure: Never   Smokeless tobacco: Never  Vaping Use   Vaping status: Never Used  Substance Use  Topics   Alcohol use: Yes    Alcohol/week: 1.0 standard drink of alcohol    Types: 1 Standard drinks or equivalent per week    Comment: Occassionally.   Drug use: No     Allergies   Codeine   Review of Systems Review of Systems  Constitutional:  Positive for fever.  HENT:  Positive for sore throat.   Respiratory:  Positive for cough and shortness of breath. Negative for chest tightness.   Cardiovascular:  Negative for chest pain and palpitations.  Genitourinary:  Positive for difficulty urinating.  Neurological:  Positive for dizziness.  All other systems reviewed and are  negative.    Physical Exam Triage Vital Signs ED Triage Vitals  Encounter Vitals Group     BP 10/15/23 1334 124/83     Systolic BP Percentile --      Diastolic BP Percentile --      Pulse Rate 10/15/23 1334 (!) 110     Resp 10/15/23 1334 20     Temp 10/15/23 1334 99 F (37.2 C)     Temp Source 10/15/23 1334 Oral     SpO2 10/15/23 1334 99 %     Weight 10/15/23 1332 284 lb (128.8 kg)     Height 10/15/23 1332 5\' 9"  (1.753 m)     Head Circumference --      Peak Flow --      Pain Score 10/15/23 1328 0     Pain Loc --      Pain Education --      Exclude from Growth Chart --    No data found.  Updated Vital Signs BP 124/83 (BP Location: Right Arm)   Pulse (!) 108   Temp 99 F (37.2 C) (Oral)   Resp 20   Ht 5\' 9"  (1.753 m)   Wt 284 lb (128.8 kg)   SpO2 99%   BMI 41.94 kg/m   Visual Acuity Right Eye Distance:   Left Eye Distance:   Bilateral Distance:    Right Eye Near:   Left Eye Near:    Bilateral Near:     Physical Exam Vitals and nursing note reviewed.  Constitutional:      Appearance: Normal appearance. He is well-developed and well-groomed.  HENT:     Head: Normocephalic.     Right Ear: Tympanic membrane is retracted.     Left Ear: Tympanic membrane is retracted.     Nose: Congestion present.     Right Sinus: Maxillary sinus tenderness present.     Left Sinus: Maxillary sinus tenderness present.     Mouth/Throat:     Lips: Pink.     Mouth: Mucous membranes are moist.  Cardiovascular:     Rate and Rhythm: Regular rhythm. Tachycardia present.     Heart sounds: Normal heart sounds.  Pulmonary:     Effort: Pulmonary effort is normal.     Breath sounds: Normal breath sounds and air entry.  Neurological:     General: No focal deficit present.     Mental Status: He is alert and oriented to person, place, and time.     GCS: GCS eye subscore is 4. GCS verbal subscore is 5. GCS motor subscore is 6.     Cranial Nerves: No cranial nerve deficit.     Sensory:  No sensory deficit.  Psychiatric:        Attention and Perception: Attention normal.        Mood and Affect: Mood normal.  Speech: Speech normal.        Behavior: Behavior is cooperative.      UC Treatments / Results  Labs (all labs ordered are listed, but only abnormal results are displayed) Labs Reviewed  POCT FASTING CBG [facility] MANUAL ENTRY - Abnormal; Notable for the following components:      Result Value   POCT Glucose ([facility]) 245 (*)    All other components within normal limits  POCT RAPID STREP A (OFFICE) - Normal    EKG   Radiology No results found.  Procedures Procedures (including critical care time)  Medications Ordered in UC Medications - No data to display  Initial Impression / Assessment and Plan / UC Course  I have reviewed the triage vital signs and the nursing notes.  Pertinent labs & imaging results that were available during my care of the patient were reviewed by me and considered in my medical decision making (see chart for details).  Clinical Course as of 10/15/23 1452  Mon Oct 15, 2023  1422 Strep negative, culture pending, BS 245, pt sent to ER for further evaluation. [JD]    Clinical Course User Index [JD] Nyjah Denio, Para March, NP  Discussed exam findings and plan of care with patient, patient given prescription for Augmentin for maxillary sinusitis, advised patient to go to emergency room for further evaluation due to his past medical history of uncontrolled diabetes requiring admission and inability to urinate, patient verbalized understanding to this provider.  Ddx: Maxillary sinusitis, hyperglycemia, dehydration,electrolyte imbalance,smoker, no PCP Final Clinical Impressions(s) / UC Diagnoses   Final diagnoses:  Uncontrolled type 2 diabetes mellitus with hyperglycemia (HCC)  Smoker  Acute maxillary sinusitis, recurrence not specified  Does not have primary care provider     Discharge Instructions      Please go to ER for further  evaluation of uncontrolled diabetes, inability to urinate, most likely you will need labs and fluids.   Your strep is negative, your BS is 245     ED Prescriptions     Medication Sig Dispense Auth. Provider   amoxicillin-clavulanate (AUGMENTIN) 875-125 MG tablet Take 1 tablet by mouth every 12 (twelve) hours. 14 tablet Bronwyn Belasco, Para March, NP      PDMP not reviewed this encounter.   Clancy Gourd, NP 10/15/23 1453

## 2023-10-15 NOTE — ED Triage Notes (Signed)
"  I have had a sinus problem for over 3 wks now, it is hitting hard now, I am constantly dizzy the last 3 days and can't breathe at night". "I went to a primary care provider and tried to be seen and noticed how bad the dizziness was". Some sob noticed at night, my daughter has "strep throat". "I have had a Fever last night, unknown degree". No chest pain "or only if I cough way to hard". "I feel dehydrated too". Last void "after 12 Midnight". Stools "normal".

## 2024-01-25 ENCOUNTER — Encounter (HOSPITAL_COMMUNITY): Payer: Self-pay

## 2024-01-25 ENCOUNTER — Ambulatory Visit (HOSPITAL_COMMUNITY)
Admission: EM | Admit: 2024-01-25 | Discharge: 2024-01-25 | Disposition: A | Discharge status: Home / Self Care | Attending: Family Medicine | Admitting: Family Medicine

## 2024-01-25 DIAGNOSIS — M25562 Pain in left knee: Secondary | ICD-10-CM

## 2024-01-25 DIAGNOSIS — M25511 Pain in right shoulder: Secondary | ICD-10-CM

## 2024-01-25 MED ORDER — NAPROXEN 500 MG PO TABS: 500.0000 mg | ORAL_TABLET | Freq: Two times a day (BID) | ORAL | 0 refills | Status: AC

## 2024-01-25 MED ORDER — PREDNISONE 10 MG PO TABS: 20.0000 mg | ORAL_TABLET | Freq: Every day | ORAL | 0 refills | Status: AC

## 2024-01-25 NOTE — Discharge Instructions (Addendum)
 You were seen today for a flare of the chronic pain of your right shoulder and left knee, which is likely due to muscle strain from overuse. Take the medications prescribed to you as directed. Do not take any over-the-counter aspirin, Motrin, ibuprofen, or Aleve while on the prescriptions.   To help with the pain and inflammation, alternate between using ice and heat on the sore area several times a day. Always use a towel between the ice and your skin--do not place ice directly on your skin.  Avoid any heavy lifting or pulling with your affected arm for the next few days. Give your body time to rest and recover. If your pain worsens or doesn't improve, follow up with your healthcare provider or orthopedics.

## 2024-01-25 NOTE — ED Provider Notes (Signed)
 [facility]    CSN: [id number] Arrival date & time: 01/25/24  1006      History   Chief Complaint Chief Complaint  Patient presents with   Knee Pain   Shoulder Pain    HPI Chase Gray is a 38 y.o. male.   Subjective:  Chase Gray is a 38 year old male who presents with a flare-up of his chronic left knee and right shoulder pain. He reports a long-standing history of previous injuries to both joints, resulting in intermittent episodes of discomfort. He recently began working a second job at both [employer] and [employer], which involves frequent lifting and walking. He believes the increased physical activity is contributing to his current symptoms and feels he is overworking himself. He takes Tylenol as needed, which helps keep the pain manageable. He states that his pain is consistent with his usual baseline and has not worsened. He has missed work and is requesting a note for his employer to avoid penalty. He emphasizes that his primary reason for the visit is to obtain a work excuse.  The following portions of the patient's history were reviewed and updated as appropriate: allergies, current medications, past family history, past medical history, past social history, past surgical history, and problem list.          Past Medical History:  Diagnosis Date   Asthma    Diabetes mellitus without complication (HCC)    Psoriasis     Patient Active Problem List   Diagnosis Date Noted   Diabetes mellitus (HCC) 10/15/2023   Smoker 10/15/2023   Acute maxillary sinusitis 10/15/2023   Does not have primary care provider 10/15/2023   Childhood asthma 01/31/2016   Psoriasis 01/31/2016   Uncontrolled type 2 diabetes mellitus with hyperglycemia (HCC) 01/31/2016    Past Surgical History:  Procedure Laterality Date   NO PAST SURGERIES         Home Medications    Prior to Admission medications   Medication Sig Start Date End Date Taking?  Authorizing Provider  naproxen (NAPROSYN) 500 MG tablet Take 1 tablet (500 mg total) by mouth 2 (two) times daily with a meal. 01/25/24  Yes Lurline Idol, FNP  predniSONE (DELTASONE) 10 MG tablet Take 2 tablets (20 mg total) by mouth daily for 5 days. 01/25/24 01/30/24 Yes Lurline Idol, FNP    Family History Family History  Problem Relation Age of Onset   Hyperlipidemia Mother    Diabetes Father     Social History Social History   Tobacco Use   Smoking status: Some Days    Types: Cigars    Passive exposure: Never   Smokeless tobacco: Never  Vaping Use   Vaping status: Never Used  Substance Use Topics   Alcohol use: Yes    Alcohol/week: 1.0 standard drink of alcohol    Types: 1 Standard drinks or equivalent per week    Comment: Occassionally.   Drug use: No     Allergies   Codeine   Review of Systems Review of Systems  Musculoskeletal:  Positive for arthralgias. Negative for back pain, gait problem and joint swelling.  Neurological:  Negative for weakness and numbness.  All other systems reviewed and are negative.    Physical Exam Triage Vital Signs ED Triage Vitals  Encounter Vitals Group     BP 01/25/24 1024 128/80     Systolic BP Percentile --      Diastolic BP Percentile --      Pulse Rate  01/25/24 1024 81     Resp 01/25/24 1024 16     Temp 01/25/24 1024 98.3 F (36.8 C)     Temp Source 01/25/24 1024 Oral     SpO2 01/25/24 1024 95 %     Weight --      Height --      Head Circumference --      Peak Flow --      Pain Score 01/25/24 1022 7     Pain Loc --      Pain Education --      Exclude from Growth Chart --    No data found.  Updated Vital Signs BP 128/80 (BP Location: Left Arm)   Pulse 81   Temp 98.3 F (36.8 C) (Oral)   Resp 16   SpO2 95%   Visual Acuity Right Eye Distance:   Left Eye Distance:   Bilateral Distance:    Right Eye Near:   Left Eye Near:    Bilateral Near:     Physical Exam Vitals reviewed.   Constitutional:      General: He is not in acute distress.    Appearance: Normal appearance. He is well-developed. He is obese. He is not ill-appearing or toxic-appearing.  HENT:     Head: Normocephalic.  Cardiovascular:     Rate and Rhythm: Normal rate.  Pulmonary:     Effort: Pulmonary effort is normal.  Musculoskeletal:        General: Normal range of motion.     Right shoulder: Tenderness present. No swelling, deformity, effusion, laceration, bony tenderness or crepitus. Normal range of motion. Normal strength.     Cervical back: Normal range of motion and neck supple. No tenderness.     Left knee: No swelling, deformity, effusion, erythema, ecchymosis, lacerations, bony tenderness or crepitus. Normal range of motion. Tenderness present over the patellar tendon.  Skin:    General: Skin is warm and dry.  Neurological:     General: No focal deficit present.     Mental Status: He is alert and oriented to person, place, and time.     Cranial Nerves: No cranial nerve deficit.     Sensory: Sensation is intact. No sensory deficit.     Motor: Motor function is intact. No weakness.     Coordination: Coordination is intact.     Gait: Gait is intact.  Psychiatric:        Behavior: Behavior is cooperative.      UC Treatments / Results  Labs (all labs ordered are listed, but only abnormal results are displayed) Labs Reviewed - No data to display  EKG   Radiology No results found.  Procedures Procedures (including critical care time)  Medications Ordered in UC Medications - No data to display  Initial Impression / Assessment and Plan / UC Course  I have reviewed the triage vital signs and the nursing notes.  Pertinent labs & imaging results that were available during my care of the patient were reviewed by me and considered in my medical decision making (see chart for details).    38 year old male with a history of chronic left knee and right shoulder pain presents  requesting a work note after missing several days of work due to a flare-up of his symptoms. He reports that his current pain is consistent with his baseline and has not worsened. Physical exam is unremarkable with no focal abnormalities noted. Recommended conservative management including rest, ice, compression, elevation, NSAIDs, and a brief course  of prednisone. Work note provided as requested. Advised to follow up with orthopedics if symptoms persist or worsen.  Today's evaluation has revealed no signs of a dangerous process. Discussed diagnosis with patient and/or guardian. Patient and/or guardian aware of their diagnosis, possible red flag symptoms to watch out for and need for close follow up. Patient and/or guardian understands verbal and written discharge instructions. Patient and/or guardian comfortable with plan and disposition.  Patient and/or guardian has a clear mental status at this time, good insight into illness (after discussion and teaching) and has clear judgment to make decisions regarding their care  Documentation was completed with the aid of voice recognition software. Transcription may contain typographical errors. Final Clinical Impressions(s) / UC Diagnoses   Final diagnoses:  Right shoulder pain, unspecified chronicity  Left knee pain, unspecified chronicity     Discharge Instructions      You were seen today for a flare of the chronic pain of your right shoulder and left knee, which is likely due to muscle strain from overuse. Take the medications prescribed to you as directed. Do not take any over-the-counter aspirin, Motrin, ibuprofen, or Aleve while on the prescriptions.   To help with the pain and inflammation, alternate between using ice and heat on the sore area several times a day. Always use a towel between the ice and your skin--do not place ice directly on your skin.  Avoid any heavy lifting or pulling with your affected arm for the next few days. Give your  body time to rest and recover. If your pain worsens or doesn't improve, follow up with your healthcare provider or orthopedics.      ED Prescriptions     Medication Sig Dispense Auth. Provider   predniSONE (DELTASONE) 10 MG tablet Take 2 tablets (20 mg total) by mouth daily for 5 days. 10 tablet Lurline Idol, FNP   naproxen (NAPROSYN) 500 MG tablet Take 1 tablet (500 mg total) by mouth 2 (two) times daily with a meal. 20 tablet Lurline Idol, FNP      PDMP not reviewed this encounter.   Lurline Idol, [place] 01/25/24 1334

## 2024-01-25 NOTE — ED Triage Notes (Signed)
 Patient presenting with left knee and right shoulder chronic pain onset 1 week ago for this recent flare. Patient states recently picked up a second job so working [employer] and [employer].   Prescriptions or OTC medications tried: Yes- Blue Emu, tylenol    with mild relief
